# Patient Record
Sex: Female | Born: 1985 | Hispanic: No | Marital: Married | Smoking: Never smoker
Health system: Southern US, Community
[De-identification: ages and names within clinical notes are randomized; demographics above are authoritative.]

## PROBLEM LIST (undated history)

## (undated) DIAGNOSIS — Z789 Other specified health status: Secondary | ICD-10-CM

## (undated) HISTORY — PX: NO PAST SURGERIES: SHX2092

---

## 2018-01-06 ENCOUNTER — Encounter (HOSPITAL_COMMUNITY): Payer: Self-pay | Admitting: Emergency Medicine

## 2018-01-06 ENCOUNTER — Other Ambulatory Visit: Payer: Self-pay

## 2018-01-06 DIAGNOSIS — Z3A09 9 weeks gestation of pregnancy: Secondary | ICD-10-CM | POA: Diagnosis not present

## 2018-01-06 DIAGNOSIS — O219 Vomiting of pregnancy, unspecified: Secondary | ICD-10-CM | POA: Diagnosis present

## 2018-01-06 DIAGNOSIS — O9989 Other specified diseases and conditions complicating pregnancy, childbirth and the puerperium: Secondary | ICD-10-CM | POA: Insufficient documentation

## 2018-01-06 DIAGNOSIS — R102 Pelvic and perineal pain: Secondary | ICD-10-CM | POA: Insufficient documentation

## 2018-01-06 DIAGNOSIS — O210 Mild hyperemesis gravidarum: Secondary | ICD-10-CM | POA: Diagnosis not present

## 2018-01-06 NOTE — ED Triage Notes (Signed)
Patient presents to the ED with complaints of emesis and lower abdominal pain x1.5 months. [place]. Patient found out recently that she is pregnant. Patient unable to tolerate PO. No urinary symptoms, but patient states her urine is dark and shes going only 2x a day.

## 2018-01-07 ENCOUNTER — Emergency Department (HOSPITAL_COMMUNITY): Payer: Medicaid Other

## 2018-01-07 ENCOUNTER — Emergency Department (HOSPITAL_COMMUNITY)
Admission: EM | Admit: 2018-01-07 | Discharge: 2018-01-07 | Disposition: A | Discharge status: Home / Self Care | Payer: Medicaid Other | Attending: Emergency Medicine | Admitting: Emergency Medicine

## 2018-01-07 DIAGNOSIS — O210 Mild hyperemesis gravidarum: Secondary | ICD-10-CM

## 2018-01-07 LAB — COMPREHENSIVE METABOLIC PANEL
ALBUMIN: 3.7 g/dL (ref 3.5–5.0)
ALT: 29 U/L (ref 0–44)
ANION GAP: 10 (ref 5–15)
AST: 27 U/L (ref 15–41)
Alkaline Phosphatase: 93 U/L (ref 38–126)
BILIRUBIN TOTAL: 0.9 mg/dL (ref 0.3–1.2)
BUN: 8 mg/dL (ref 6–20)
CHLORIDE: 106 mmol/L (ref 98–111)
CO2: 19 mmol/L — ABNORMAL LOW (ref 22–32)
Calcium: 9.1 mg/dL (ref 8.9–10.3)
Creatinine, Ser: 0.4 mg/dL — ABNORMAL LOW (ref 0.44–1.00)
GFR calc Af Amer: >60 mL/min (ref >60)
GFR calc non Af Amer: >60 mL/min (ref >60)
GLUCOSE: 89 mg/dL (ref 70–99)
POTASSIUM: 3.5 mmol/L (ref 3.5–5.1)
Sodium: 135 mmol/L (ref 135–145)
TOTAL PROTEIN: 7.4 g/dL (ref 6.5–8.1)

## 2018-01-07 LAB — CBC WITH DIFFERENTIAL/PLATELET
Basophils Absolute: 0 10*3/uL (ref 0.0–0.1)
Basophils Relative: 0 %
EOS PCT: 1 %
Eosinophils Absolute: 0 10*3/uL (ref 0.0–0.7)
HEMATOCRIT: 36.7 % (ref 36.0–46.0)
Hemoglobin: 12.4 g/dL (ref 12.0–15.0)
Lymphocytes Relative: 45 %
Lymphs Abs: 2.3 10*3/uL (ref 0.7–4.0)
MCH: 28.2 pg (ref 26.0–34.0)
MCHC: 33.8 g/dL (ref 30.0–36.0)
MCV: 83.6 fL (ref 78.0–100.0)
MONO ABS: 0.4 10*3/uL (ref 0.1–1.0)
MONOS PCT: 7 %
Neutro Abs: 2.3 10*3/uL (ref 1.7–7.7)
Neutrophils Relative %: 47 %
PLATELETS: 217 10*3/uL (ref 150–400)
RBC: 4.39 MIL/uL (ref 3.87–5.11)
RDW: 14.2 % (ref 11.5–15.5)
WBC: 5 10*3/uL (ref 4.0–10.5)

## 2018-01-07 LAB — LIPASE, BLOOD: Lipase: 36 U/L (ref 11–51)

## 2018-01-07 LAB — HCG, QUANTITATIVE, PREGNANCY: hCG, Beta Chain, Quant, S: 147566 m[IU]/mL — ABNORMAL HIGH (ref <5)

## 2018-01-07 MED ORDER — DOXYLAMINE SUCCINATE (SLEEP) 25 MG PO TABS: 12.5000 mg | ORAL_TABLET | Freq: Two times a day (BID) | ORAL | 0 refills | Status: AC | PRN

## 2018-01-07 MED ORDER — ONDANSETRON HCL 4 MG/2ML IJ SOLN
4.0000 mg | Freq: Once | INTRAMUSCULAR | Status: AC
  Administered 2018-01-07: 4 mg via INTRAVENOUS
  Filled 2018-01-07: qty 2

## 2018-01-07 MED ORDER — ONDANSETRON 4 MG PO TBDP: 4.0000 mg | ORAL_TABLET | Freq: Three times a day (TID) | ORAL | 0 refills | Status: AC | PRN

## 2018-01-07 MED ORDER — PYRIDOXINE HCL 25 MG PO TABS: 12.5000 mg | ORAL_TABLET | Freq: Two times a day (BID) | ORAL | 0 refills | Status: AC | PRN

## 2018-01-07 MED ORDER — SODIUM CHLORIDE 0.9 % IV BOLUS
1000.0000 mL | Freq: Once | INTRAVENOUS | Status: AC
  Administered 2018-01-07: 1000 mL via INTRAVENOUS

## 2018-01-07 NOTE — ED Provider Notes (Signed)
[facility]-EMERGENCY DEPT Provider Note  CSN: [id number] Arrival date & time: 01/06/18 2119  Chief Complaint(s) Emesis  HPI Jane Flores is a 32 y.o. female G3, P2 approximately [redacted] weeks pregnant by last menstrual period (June 1) who presents to the emergency department with persistent nausea and vomiting associated to the pregnancy.  She reports that over the last 2 weeks her symptoms have worsened stating that she is not able to tolerate much p.o. intake.  She denies any recent fevers or infections.  No recent antibiotic use.  No known sick contacts.  Patient just returned from her home country of [place].  She is endorsing lower abdominal discomfort that is been as constant for the past several weeks.  No alleviating or aggravating factors.  She denies any urinary symptoms.  No vaginal bleeding or discharge.  HPI  Past Medical History History reviewed. No pertinent past medical history. There are no active problems to display for this patient.  Home Medication(s) Prior to Admission medications   Medication Sig Start Date End Date Taking? Authorizing Provider  folic acid (FOLVITE) 400 MCG tablet Take 400 mcg by mouth 2 (two) times daily.   Yes [provider]  doxylamine, Sleep, (UNISOM) 25 MG tablet Take 0.5 tablets (12.5 mg total) by mouth 2 (two) times daily as needed (nausea). 01/07/18 02/06/18  Nira Connardama, Iam Lipson Eduardo, MD  ondansetron (ZOFRAN ODT) 4 MG disintegrating tablet Take 1 tablet (4 mg total) by mouth every 8 (eight) hours as needed for up to 3 days for nausea or vomiting. 01/07/18 01/10/18  Dalinda Heidt, Amadeo GarnetPedro Eduardo, MD  pyridOXINE (VITAMIN B-6) 25 MG tablet Take 0.5 tablets (12.5 mg total) by mouth 2 (two) times daily as needed. 01/07/18 02/06/18  Nira Connardama, Benard Minturn Eduardo, MD                                                                                                                                    Past Surgical History History reviewed. No pertinent  surgical history. Family History No family history on file.  Social History Social History   Tobacco Use  . Smoking status: Never Smoker  . Smokeless tobacco: Never Used  Substance Use Topics  . Alcohol use: Never    Frequency: Never  . Drug use: Never   Allergies Patient has no known allergies.  Review of Systems Review of Systems All other systems are reviewed and are negative for acute change except as noted in the HPI  Physical Exam Vital Signs  I have reviewed the triage vital signs BP 121/85   Pulse 68   Temp 98.4 F (36.9 C) (Oral)   Resp 20   SpO2 100%   Physical Exam  Constitutional: She is oriented to person, place, and time. She appears well-developed and well-nourished. No distress.  HENT:  Head: Normocephalic and atraumatic.  Right Ear: External ear normal.  Left Ear: External ear normal.  Nose: Nose normal.  Eyes: Conjunctivae and EOM are normal. No scleral icterus.  Neck: Normal range of motion and phonation normal.  Cardiovascular: Normal rate and regular rhythm.  Pulmonary/Chest: Effort normal. No stridor. No respiratory distress.  Abdominal: She exhibits no distension. There is tenderness (discomfort) in the suprapubic area. There is no rigidity, no rebound and no guarding.  Musculoskeletal: Normal range of motion. She exhibits no edema.  Neurological: She is alert and oriented to person, place, and time.  Skin: She is not diaphoretic.  Psychiatric: She has a normal mood and affect. Her behavior is normal.  Vitals reviewed.   ED Results and Treatments Labs (all labs ordered are listed, but only abnormal results are displayed) Labs Reviewed  COMPREHENSIVE METABOLIC PANEL - Abnormal; Notable for the following components:      Result Value   CO2 19 (*)    Creatinine, Ser 0.40 (*)    All other components within normal limits  HCG, QUANTITATIVE, PREGNANCY - Abnormal; Notable for the following components:   hCG, Beta Chain, Quant, S 147,566 (*)     All other components within normal limits  CBC WITH DIFFERENTIAL/PLATELET  LIPASE, BLOOD                                                                                                                         EKG  EKG Interpretation  Date/Time:    Ventricular Rate:    PR Interval:    QRS Duration:   QT Interval:    QTC Calculation:   R Axis:     Text Interpretation:        Radiology US Ob Comp < 14 Wks  Result Date: 01/07/2018 CLINICAL DATA:  Acute onset of generalized abdominal discomfort. EXAM: OBSTETRIC <14 WK ULTRASOUND TECHNIQUE: Transabdominal ultrasound was performed for evaluation of the gestation as well as the maternal uterus and adnexal regions. COMPARISON:  None. FINDINGS: Intrauterine gestational sac: Single; visualized and normal in shape. Yolk sac:  No Embryo:  Yes Cardiac Activity: Yes Heart Rate: 126 bpm CRL: 3.6 cm   10 w 3 d                  [place] EDC: 08/02/2018 Subchorionic hemorrhage: A small amount of subchorionic hemorrhage is noted. Maternal uterus/adnexae: The uterus is otherwise unremarkable in appearance. The ovaries are within normal limits. There is no evidence of ovarian torsion. No suspicious adnexal masses are seen. No free fluid is seen within the pelvic cul-de-sac. IMPRESSION: 1. Single live intrauterine pregnancy, with a crown-rump length of 3.6 cm, corresponding to a gestational age of [redacted] weeks 3 days. This reflects an estimated date of delivery of August 02, 2018. 2. Small amount of subchorionic hemorrhage noted. Electronically Signed   By: Roanna Raider M.D.   On: 01/07/2018 04:18   Pertinent labs & imaging results that were available during my care of the patient were reviewed by me and considered in my medical decision making (see chart for details).  Medications Ordered  in ED Medications  sodium chloride 0.9 % bolus 1,000 mL (0 mLs Intravenous Stopped 01/07/18 0247)  ondansetron (ZOFRAN) injection 4 mg (4 mg Intravenous Given 01/07/18 0145)                                                                                                                                     Procedures Procedures  (including critical care time)  Medical Decision Making / ED Course I have reviewed the nursing notes for this encounter and the patient's prior records (if available in EHR or on provided paperwork).    Patient here with nausea vomiting in the setting of pregnancy.  Has pelvic discomfort.  Ultrasound revealed IUP at 10 weeks and 3 days.  Labs grossly reassuring without significant electrolyte derangements or renal insufficiency.  CBC reassuring.  Patient provided with symptomatic management and IV fluids.  She was able to tolerate oral hydration following this.  No suspicion for serious intra-abdominal inflammatory/infectious process.  No need for advanced imaging at this time.  The patient appears reasonably screened and/or stabilized for discharge and I doubt any other medical condition or other [facility] requiring further screening, evaluation, or treatment in the ED at this time prior to discharge.  The patient is safe for discharge with strict return precautions.   Final Clinical Impression(s) / ED Diagnoses Final diagnoses:  Hyperemesis gravidarum   Disposition: Discharge  Condition: Good  I have discussed the results, Dx and Tx plan with the patient who expressed understanding and agree(s) with the plan. Discharge instructions discussed at great length. The patient was given strict return precautions who verbalized understanding of the instructions. No further questions at time of discharge.    ED Discharge Orders         Ordered    ondansetron (ZOFRAN ODT) 4 MG disintegrating tablet  Every 8 hours PRN     01/07/18 0520    pyridOXINE (VITAMIN B-6) 25 MG tablet  2 times daily PRN     01/07/18 0520    doxylamine, Sleep, (UNISOM) 25 MG tablet  2 times daily PRN     01/07/18 0520           Follow Up: Obstetrician  Schedule an  appointment as soon as possible for a visit  As needed      This chart was dictated using voice recognition software.  Despite best efforts to proofread,  errors can occur which can change the documentation meaning.   Nira Conn, MD 01/07/18 [phone]

## 2018-02-21 LAB — OB RESULTS CONSOLE RUBELLA ANTIBODY, IGM: RUBELLA: IMMUNE

## 2018-02-21 LAB — OB RESULTS CONSOLE GC/CHLAMYDIA
Chlamydia: NEGATIVE
Gonorrhea: NEGATIVE

## 2018-02-21 LAB — OB RESULTS CONSOLE ANTIBODY SCREEN: Antibody Screen: NEGATIVE

## 2018-02-21 LAB — OB RESULTS CONSOLE ABO/RH: RH Type: POSITIVE

## 2018-02-21 LAB — OB RESULTS CONSOLE HIV ANTIBODY (ROUTINE TESTING): HIV: NONREACTIVE

## 2018-02-21 LAB — OB RESULTS CONSOLE HEPATITIS B SURFACE ANTIGEN: Hepatitis B Surface Ag: NEGATIVE

## 2018-02-21 LAB — OB RESULTS CONSOLE RPR: RPR: NONREACTIVE

## 2018-05-16 ENCOUNTER — Other Ambulatory Visit (HOSPITAL_COMMUNITY): Payer: Self-pay | Admitting: Obstetrics and Gynecology

## 2018-05-16 DIAGNOSIS — Z3A30 30 weeks gestation of pregnancy: Secondary | ICD-10-CM

## 2018-05-16 DIAGNOSIS — Z363 Encounter for antenatal screening for malformations: Secondary | ICD-10-CM

## 2018-05-16 DIAGNOSIS — O4403 Placenta previa specified as without hemorrhage, third trimester: Secondary | ICD-10-CM

## 2018-05-17 ENCOUNTER — Encounter (HOSPITAL_COMMUNITY): Payer: Self-pay

## 2018-05-26 ENCOUNTER — Ambulatory Visit (HOSPITAL_COMMUNITY): Payer: Medicaid Other

## 2018-06-08 ENCOUNTER — Encounter (HOSPITAL_COMMUNITY): Payer: Self-pay | Admitting: *Deleted

## 2018-06-12 ENCOUNTER — Other Ambulatory Visit (HOSPITAL_COMMUNITY): Payer: Self-pay | Admitting: Obstetrics and Gynecology

## 2018-06-12 ENCOUNTER — Other Ambulatory Visit (HOSPITAL_COMMUNITY): Payer: Self-pay | Admitting: *Deleted

## 2018-06-12 ENCOUNTER — Ambulatory Visit (HOSPITAL_COMMUNITY)
Admission: RE | Admit: 2018-06-12 | Discharge: 2018-06-12 | Disposition: A | Discharge status: Home / Self Care | Payer: Medicaid Other | Source: Ambulatory Visit | Attending: Obstetrics and Gynecology | Admitting: Obstetrics and Gynecology

## 2018-06-12 ENCOUNTER — Encounter (HOSPITAL_COMMUNITY): Payer: Self-pay

## 2018-06-12 DIAGNOSIS — O4403 Placenta previa specified as without hemorrhage, third trimester: Secondary | ICD-10-CM | POA: Insufficient documentation

## 2018-06-12 DIAGNOSIS — Z3A32 32 weeks gestation of pregnancy: Secondary | ICD-10-CM

## 2018-06-12 DIAGNOSIS — Z3A30 30 weeks gestation of pregnancy: Secondary | ICD-10-CM | POA: Diagnosis present

## 2018-06-12 DIAGNOSIS — O4400 Placenta previa specified as without hemorrhage, unspecified trimester: Secondary | ICD-10-CM

## 2018-06-12 DIAGNOSIS — Z363 Encounter for antenatal screening for malformations: Secondary | ICD-10-CM | POA: Insufficient documentation

## 2018-06-12 HISTORY — DX: Other specified health status: Z78.9

## 2018-07-06 ENCOUNTER — Encounter (HOSPITAL_COMMUNITY): Payer: Self-pay

## 2018-07-10 ENCOUNTER — Encounter (HOSPITAL_COMMUNITY): Payer: Self-pay

## 2018-07-10 ENCOUNTER — Ambulatory Visit (HOSPITAL_COMMUNITY)
Admission: RE | Admit: 2018-07-10 | Discharge: 2018-07-10 | Disposition: A | Discharge status: Home / Self Care | Payer: Medicaid Other | Source: Ambulatory Visit | Attending: Obstetrics and Gynecology | Admitting: Obstetrics and Gynecology

## 2018-07-10 DIAGNOSIS — Z362 Encounter for other antenatal screening follow-up: Secondary | ICD-10-CM | POA: Diagnosis not present

## 2018-07-10 DIAGNOSIS — Z3A36 36 weeks gestation of pregnancy: Secondary | ICD-10-CM | POA: Diagnosis not present

## 2018-07-10 DIAGNOSIS — O4400 Placenta previa specified as without hemorrhage, unspecified trimester: Secondary | ICD-10-CM

## 2018-07-11 ENCOUNTER — Encounter (HOSPITAL_COMMUNITY): Payer: Self-pay | Admitting: *Deleted

## 2018-07-11 ENCOUNTER — Inpatient Hospital Stay (HOSPITAL_COMMUNITY)
Admission: AD | Admit: 2018-07-11 | Discharge: 2018-07-11 | Disposition: A | Discharge status: Home / Self Care | Payer: Medicaid Other | Source: Ambulatory Visit | Attending: Obstetrics | Admitting: Obstetrics

## 2018-07-11 ENCOUNTER — Telehealth (HOSPITAL_COMMUNITY): Payer: Self-pay | Admitting: *Deleted

## 2018-07-11 ENCOUNTER — Other Ambulatory Visit: Payer: Self-pay | Admitting: Obstetrics and Gynecology

## 2018-07-11 ENCOUNTER — Encounter (HOSPITAL_COMMUNITY): Payer: Self-pay

## 2018-07-11 DIAGNOSIS — O4700 False labor before 37 completed weeks of gestation, unspecified trimester: Secondary | ICD-10-CM

## 2018-07-11 DIAGNOSIS — O4403 Placenta previa specified as without hemorrhage, third trimester: Secondary | ICD-10-CM | POA: Diagnosis not present

## 2018-07-11 DIAGNOSIS — Z3A36 36 weeks gestation of pregnancy: Secondary | ICD-10-CM | POA: Insufficient documentation

## 2018-07-11 DIAGNOSIS — O479 False labor, unspecified: Secondary | ICD-10-CM

## 2018-07-11 LAB — URINALYSIS, ROUTINE W REFLEX MICROSCOPIC
Bilirubin Urine: NEGATIVE
Glucose, UA: NEGATIVE mg/dL
Hgb urine dipstick: NEGATIVE
Ketones, ur: NEGATIVE mg/dL
LEUKOCYTES UA: NEGATIVE
NITRITE: NEGATIVE
PROTEIN: NEGATIVE mg/dL
Specific Gravity, Urine: <1.005 — ABNORMAL LOW (ref 1.005–1.030)
pH: 5 (ref 5.0–8.0)

## 2018-07-11 MED ORDER — DIPHENHYDRAMINE HCL 50 MG/ML IJ SOLN
25.0000 mg | Freq: Once | INTRAMUSCULAR | Status: AC
  Administered 2018-07-11: 25 mg via INTRAVENOUS
  Filled 2018-07-11: qty 1

## 2018-07-11 MED ORDER — LACTATED RINGERS IV SOLN
Freq: Once | INTRAVENOUS | Status: AC
  Administered 2018-07-11: 03:00:00 via INTRAVENOUS

## 2018-07-11 MED ORDER — METOCLOPRAMIDE HCL 5 MG/ML IJ SOLN
10.0000 mg | Freq: Once | INTRAMUSCULAR | Status: AC
  Administered 2018-07-11: 10 mg via INTRAVENOUS
  Filled 2018-07-11: qty 2

## 2018-07-11 MED ORDER — ACETAMINOPHEN 500 MG PO TABS
1000.0000 mg | ORAL_TABLET | Freq: Once | ORAL | Status: AC
  Administered 2018-07-11: 1000 mg via ORAL
  Filled 2018-07-11: qty 2

## 2018-07-11 MED ORDER — LACTATED RINGERS IV SOLN
Freq: Once | INTRAVENOUS | Status: AC
  Administered 2018-07-11: 04:00:00 via INTRAVENOUS

## 2018-07-11 NOTE — Progress Notes (Signed)
Pt called out crying and c/o of dizziness after IV benadryl.  Explained to pt benadryl may can cause dizziness and result in sleeping. Helped pt to bathroom. Pt resting in bed.

## 2018-07-11 NOTE — MAU Note (Signed)
PT SAYS  STRONG UC'S SINCE 3PM.  PNC  WITH DR Chestine SporeLARK --  U/S TODAY-  SHOWS  STILL PLACENTA PREVIA.  -  BUT NO BLEEDING

## 2018-07-11 NOTE — Progress Notes (Signed)
Discharge instructions given. Pt  v/s wnl and stable for discharge.

## 2018-07-11 NOTE — Discharge Instructions (Signed)
Placenta Previa °Placenta previa is a condition in which the placenta implants in the lower part of the uterus in pregnant women. The placenta either partially or completely covers the opening to the cervix. This is a problem because the baby must pass through the cervix during delivery. There are three types of placenta previa: °· Marginal placenta previa. The placenta reaches within an inch (2.5 cm) of the cervical opening but does not cover it. °· Partial placenta previa. The placenta covers part of the cervical opening. °· Complete placenta previa. The placenta covers the entire cervical opening. °If the previa is marginal or partial and it is diagnosed in the first half of pregnancy, the placenta may move into a normal position as the pregnancy progresses and may no longer cover the cervix. It is important to keep all prenatal visits with your health care provider so you can be more closely monitored. °What are the causes? °The cause of this condition is not known. °What increases the risk? °This condition is more likely to develop in women who: °· Are carrying more than one baby (multiples). °· Have an abnormally shaped uterus. °· Have scars on the lining of the uterus. °· Have had surgeries involving the uterus, such as a cesarean delivery. °· Have delivered a baby before. °· Have a history of placenta previa. °· Have smoked or used cocaine during pregnancy. °· Are age 35 or older during pregnancy. °What are the signs or symptoms? °The main symptom of this condition is sudden, painless vaginal bleeding during the second half of pregnancy. The amount of bleeding can be very light at first, and it usually stops on its own. Heavier bleeding episodes may also happen. Some women with placenta previa may have no bleeding at all. °How is this diagnosed? °· This condition is diagnosed: °? From an ultrasound. This test uses sound waves to find where the placenta is located before you have any bleeding  episodes. °? During a checkup after vaginal bleeding is noticed. °· If you are diagnosed with a partial or complete previa, digital exams with fingers will generally be avoided. Your health care provider will still perform a speculum exam. °· If you did not have an ultrasound during your pregnancy, placenta previa may not be diagnosed until bleeding occurs during labor. °How is this treated? °Treatment for this condition may include: °· Decreased activity. °· Bed rest at home or in the hospital. °· Pelvic rest. Nothing is placed inside the vagina during pelvic rest. This means not having sex and not using tampons or douches. °· A blood transfusion to replace blood that you have lost (maternal blood loss). °· A cesarean delivery. This may be performed if: °? The bleeding is heavy and cannot be controlled. °? The placenta completely covers the cervix. °· Medicines to stop premature labor or to help the baby's lungs to mature. This treatment may be used if you need delivery before your pregnancy is full-term. °Your treatment will be decided based on: °· How much you are bleeding, or whether the bleeding has stopped. °· How far along you are in your pregnancy. °· The condition of your baby. °· The type of placenta previa that you have. ° °Follow these instructions at home: °· Get plenty of rest and lessen activity as told by your health care provider. °· Stay on bed rest for as long as told by your health care provider. °· Do not have sex, use tampons, use a douche, or place anything inside of   plenty of rest and lessen activity as told by your health care provider.   Stay on bed rest for as long as told by your health care provider.   Do not have sex, use tampons, use a douche, or place anything inside of your vagina if your health care provider recommended pelvic rest.   Take over-the-counter and prescription medicines as told by your health care provider.   Keep all follow-up visits as told by your health care provider. This is important.  Get help right away if:   You have vaginal bleeding, even if in small amounts and even if you have no pain.   You have cramping or regular contractions.   You have pain in your abdomen or  your lower back.   You have a feeling of increased pressure in your pelvis.   You have increased watery or bloody mucus from the vagina.  This information is not intended to replace advice given to you by your health care provider. Make sure you discuss any questions you have with your health care provider.  Document Released: 05/17/2005 Document Revised: 02/04/2016 Document Reviewed: 11/29/2015  Elsevier Interactive Patient Education  2019 [employer]

## 2018-07-11 NOTE — MAU Provider Note (Addendum)
History     CSN: [id number]  Arrival date and time: 07/11/18 0126   First Provider Initiated Contact with Patient 07/11/18 0212      No chief complaint on file.  HPI Jane Flores is a 33 y.o. G3P2002 at [redacted]w[redacted]d who presents to MAU with chief complaint of abdominal contractions in the setting of placenta previa. This is a new problem onset yesterday 07/10/2018 at about 3pm.  Her contractions are located bilaterally in her lower abdomen. She rates them as 8/10. The pain does not radiate. She denies aggravating or alleviating factors. She has not taken medication or tried other treatments for this pain.  Patient denies bleeding. She also denies  leaking of fluid, decreased fetal movement, fever, falls, or recent illness.    OB History    Gravida  3   Para  2   Term  2   Preterm      AB      Living  2     SAB      TAB      Ectopic      Multiple      Live Births              Past Medical History:  Diagnosis Date  . Medical history non-contributory     Past Surgical History:  Procedure Laterality Date  . NO PAST SURGERIES      No family history on file.  Social History   Tobacco Use  . Smoking status: Never Smoker  . Smokeless tobacco: Never Used  Substance Use Topics  . Alcohol use: Never    Frequency: Never  . Drug use: Never    Allergies: No Known Allergies  Medications Prior to Admission  Medication Sig Dispense Refill Last Dose  . Ferrous Sulfate (IRON PO) Take by mouth.   07/10/2018 at Unknown time  . Prenatal Vit-Fe Fumarate-FA (PRENATAL VITAMIN PO) Take by mouth.   07/10/2018 at Unknown time  . doxylamine, Sleep, (UNISOM) 25 MG tablet Take 0.5 tablets (12.5 mg total) by mouth 2 (two) times daily as needed (nausea). 30 tablet 0   . folic acid (FOLVITE) 400 MCG tablet Take 400 mcg by mouth 2 (two) times daily.   Not Taking    Review of Systems  Constitutional: Negative for chills, fatigue and fever.  Gastrointestinal: Positive for abdominal  pain. Negative for nausea and vomiting.  Genitourinary: Negative for vaginal bleeding, vaginal discharge and vaginal pain.  Musculoskeletal: Negative for back pain.  Neurological: Negative for headaches.  All other systems reviewed and are negative.  Physical Exam   Blood pressure 126/73, pulse 81, temperature 97.9 F (36.6 C), temperature source Oral, resp. rate 20, height 5' 4.5" (1.638 m), weight 73.6 kg, last menstrual period 10/29/2017.  Physical Exam  Nursing note and vitals reviewed. Constitutional: She is oriented to person, place, and time. She appears well-developed and well-nourished.  Cardiovascular: Normal rate.  Respiratory: Effort normal.  GI: There is no abdominal tenderness.  Gravid  Neurological: She is alert and oriented to person, place, and time.  Skin: Skin is warm and dry.  Psychiatric: She has a normal mood and affect. Her behavior is normal. Judgment and thought content normal.   MAU Course/MDM  Procedures  --Dr. Chestine Spore made aware of patient's presence in MAU and plan of care at 0238 --Patient declines analgesia beyond those ordered as identified below --Reactive tracing: baseline 125, moderate variability, positive accels, no decels --Toco: irregular mild contractions resolving with IV fluids, uterine  irritability --Closed cervix confirmed with sterile speculum exam prior to discharge, s/p 2.5 hours monitoring in MAU --No bleeding noted at any time during evaluation in MAU --Patient is aware of MFM recommendation that she be scheduled for c section no later than 37 weeks. She understands that in the event of worsening acuity she would be prepped for stat cesarean section due to placenta previa  Orders Placed This Encounter  Procedures  . Urinalysis, Routine w reflex microscopic  . Insert peripheral IV  . Discontinue IV  . Discharge patient   Meds ordered this encounter  Medications  . lactated ringers infusion  . acetaminophen (TYLENOL) tablet 1,000  mg  . lactated ringers infusion  . metoCLOPramide (REGLAN) injection 10 mg  . diphenhydrAMINE (BENADRYL) injection 25 mg    Results for orders placed or performed during the hospital encounter of 07/11/18 (from the past 24 hour(s))  Urinalysis, Routine w reflex microscopic     Status: Abnormal   Collection Time: 07/11/18  2:34 AM  Result Value Ref Range   Color, Urine YELLOW YELLOW   APPearance CLEAR CLEAR   Specific Gravity, Urine <1.005 (L) 1.005 - 1.030   pH 5.0 5.0 - 8.0   Glucose, UA NEGATIVE NEGATIVE mg/dL   Hgb urine dipstick NEGATIVE NEGATIVE   Bilirubin Urine NEGATIVE NEGATIVE   Ketones, ur NEGATIVE NEGATIVE mg/dL   Protein, ur NEGATIVE NEGATIVE mg/dL   Nitrite NEGATIVE NEGATIVE   Leukocytes, UA NEGATIVE NEGATIVE     Assessment and Plan  --33 y.o. [id number] at [redacted]w[redacted]d  --Reactive tracing --Preterm contractions without cervical change or bleeding --Patient's pain 2/10 at discharge --Reviewed labor precautions in setting of previa at bedside with patient --Discharge home in stable condition  F/U: Patient has OB appt Friday 07/14/2018.   Calvert Cantor, CNM 07/11/2018, 5:00 AM

## 2018-07-14 ENCOUNTER — Encounter (HOSPITAL_COMMUNITY)
Admission: RE | Admit: 2018-07-14 | Discharge: 2018-07-14 | Disposition: A | Discharge status: Home / Self Care | Payer: Medicaid Other | Source: Ambulatory Visit | Attending: Obstetrics and Gynecology | Admitting: Obstetrics and Gynecology

## 2018-07-14 ENCOUNTER — Other Ambulatory Visit: Payer: Self-pay | Admitting: Obstetrics and Gynecology

## 2018-07-14 DIAGNOSIS — Z01812 Encounter for preprocedural laboratory examination: Secondary | ICD-10-CM | POA: Diagnosis present

## 2018-07-14 LAB — CBC
HCT: 33.7 % — ABNORMAL LOW (ref 36.0–46.0)
Hemoglobin: 10.6 g/dL — ABNORMAL LOW (ref 12.0–15.0)
MCH: 28 pg (ref 26.0–34.0)
MCHC: 31.5 g/dL (ref 30.0–36.0)
MCV: 88.9 fL (ref 80.0–100.0)
Platelets: 163 10*3/uL (ref 150–400)
RBC: 3.79 MIL/uL — ABNORMAL LOW (ref 3.87–5.11)
RDW: 17.5 % — ABNORMAL HIGH (ref 11.5–15.5)
WBC: 4 10*3/uL (ref 4.0–10.5)
nRBC: 0 % (ref 0.0–0.2)

## 2018-07-14 LAB — ABO/RH: ABO/RH(D): O POS

## 2018-07-14 MED ORDER — SODIUM CHLORIDE 0.9% IV SOLUTION: Freq: Once | INTRAVENOUS | Status: AC

## 2018-07-14 NOTE — Patient Instructions (Signed)
Jane Flores  07/14/2018   Your procedure is scheduled on:  07/17/2018  Enter through the Main Entrance of [facility] at 1000 AM.  Pick up the phone at the desk and dial [phone]  Call this number if you have problems the morning of surgery:[phone]  Remember:   Do not eat food:(After Midnight) Desps de medianoche.  Do not drink clear liquids: (After Midnight) Desps de medianoche.  Take these medicines the morning of surgery with A SIP OF WATER: none   Do not wear jewelry, make-up or nail polish.  Do not wear lotions, powders, or perfumes. Do not wear deodorant.  Do not shave 48 hours prior to surgery.  Do not bring valuables to the hospital.  [facility] is not   responsible for any belongings or valuables brought to the hospital.  Contacts, dentures or bridgework may not be worn into surgery.  Leave suitcase in the car. After surgery it may be brought to your room.  For patients admitted to the hospital, checkout time is 11:00 AM the day of              discharge.    N/A   Please read over the following fact sheets that you were given:   Surgical Site Infection Prevention

## 2018-07-15 LAB — RPR: RPR Ser Ql: NONREACTIVE

## 2018-07-17 ENCOUNTER — Inpatient Hospital Stay (HOSPITAL_COMMUNITY): Payer: Medicaid Other | Admitting: Anesthesiology

## 2018-07-17 ENCOUNTER — Encounter (HOSPITAL_COMMUNITY): Payer: Self-pay | Admitting: *Deleted

## 2018-07-17 ENCOUNTER — Encounter (HOSPITAL_COMMUNITY)
Admission: AD | Disposition: A | Discharge status: Home / Self Care | Payer: Self-pay | Source: Home / Self Care | Attending: Obstetrics and Gynecology

## 2018-07-17 ENCOUNTER — Inpatient Hospital Stay (HOSPITAL_COMMUNITY)
Admission: AD | Admit: 2018-07-17 | Discharge: 2018-07-20 | DRG: 788 | Disposition: A | Discharge status: Home / Self Care | Payer: Medicaid Other | Attending: Obstetrics and Gynecology | Admitting: Obstetrics and Gynecology

## 2018-07-17 DIAGNOSIS — Z3A37 37 weeks gestation of pregnancy: Secondary | ICD-10-CM | POA: Diagnosis not present

## 2018-07-17 DIAGNOSIS — O9902 Anemia complicating childbirth: Secondary | ICD-10-CM | POA: Diagnosis present

## 2018-07-17 DIAGNOSIS — O4403 Placenta previa specified as without hemorrhage, third trimester: Secondary | ICD-10-CM | POA: Diagnosis present

## 2018-07-17 DIAGNOSIS — D509 Iron deficiency anemia, unspecified: Secondary | ICD-10-CM | POA: Diagnosis present

## 2018-07-17 LAB — PREPARE RBC (CROSSMATCH)

## 2018-07-17 SURGERY — Surgical Case
Anesthesia: Spinal

## 2018-07-17 MED ORDER — FENTANYL CITRATE (PF) 100 MCG/2ML IJ SOLN: 25.0000 ug | INTRAMUSCULAR | Status: DC | PRN

## 2018-07-17 MED ORDER — KETOROLAC TROMETHAMINE 30 MG/ML IJ SOLN
30.0000 mg | Freq: Four times a day (QID) | INTRAMUSCULAR | Status: DC | PRN
  Administered 2018-07-17 – 2018-07-18 (×2): 30 mg via INTRAVENOUS
  Filled 2018-07-17 (×2): qty 1

## 2018-07-17 MED ORDER — SIMETHICONE 80 MG PO CHEW
80.0000 mg | CHEWABLE_TABLET | ORAL | Status: DC | PRN
  Administered 2018-07-19 (×2): 80 mg via ORAL

## 2018-07-17 MED ORDER — FENTANYL CITRATE (PF) 100 MCG/2ML IJ SOLN
INTRAMUSCULAR | Status: DC | PRN
  Administered 2018-07-17: 15 ug via INTRATHECAL

## 2018-07-17 MED ORDER — ACETAMINOPHEN 500 MG PO TABS
1000.0000 mg | ORAL_TABLET | Freq: Four times a day (QID) | ORAL | Status: DC
  Administered 2018-07-17 – 2018-07-18 (×3): 1000 mg via ORAL
  Filled 2018-07-17 (×3): qty 2

## 2018-07-17 MED ORDER — DIBUCAINE 1 % RE OINT: 1.0000 "application " | TOPICAL_OINTMENT | RECTAL | Status: DC | PRN

## 2018-07-17 MED ORDER — METHYLERGONOVINE MALEATE 0.2 MG/ML IJ SOLN: 0.2000 mg | INTRAMUSCULAR | Status: DC | PRN

## 2018-07-17 MED ORDER — SODIUM CHLORIDE 0.9 % IV SOLN
INTRAVENOUS | Status: DC | PRN
  Administered 2018-07-17: 13:00:00 via INTRAVENOUS

## 2018-07-17 MED ORDER — NALBUPHINE HCL 10 MG/ML IJ SOLN: 5.0000 mg | Freq: Once | INTRAMUSCULAR | Status: DC | PRN

## 2018-07-17 MED ORDER — MORPHINE SULFATE (PF) 0.5 MG/ML IJ SOLN
INTRAMUSCULAR | Status: AC
  Filled 2018-07-17: qty 10

## 2018-07-17 MED ORDER — CEFAZOLIN SODIUM-DEXTROSE 2-3 GM-%(50ML) IV SOLR: INTRAVENOUS | Status: DC | PRN

## 2018-07-17 MED ORDER — DIPHENHYDRAMINE HCL 25 MG PO CAPS
25.0000 mg | ORAL_CAPSULE | ORAL | Status: DC | PRN
  Administered 2018-07-17: 25 mg via ORAL
  Filled 2018-07-17 (×2): qty 1

## 2018-07-17 MED ORDER — MEPERIDINE HCL 25 MG/ML IJ SOLN: 6.2500 mg | INTRAMUSCULAR | Status: DC | PRN

## 2018-07-17 MED ORDER — ZOLPIDEM TARTRATE 5 MG PO TABS: 5.0000 mg | ORAL_TABLET | Freq: Every evening | ORAL | Status: DC | PRN

## 2018-07-17 MED ORDER — NALOXONE HCL 0.4 MG/ML IJ SOLN
0.4000 mg | INTRAMUSCULAR | Status: DC | PRN
  Filled 2018-07-17: qty 1

## 2018-07-17 MED ORDER — CEFAZOLIN SODIUM-DEXTROSE 2-4 GM/100ML-% IV SOLN
2.0000 g | INTRAVENOUS | Status: AC
  Administered 2018-07-17: 2 g via INTRAVENOUS
  Filled 2018-07-17: qty 100

## 2018-07-17 MED ORDER — LACTATED RINGERS IV SOLN
INTRAVENOUS | Status: DC | PRN
  Administered 2018-07-17 (×3): via INTRAVENOUS

## 2018-07-17 MED ORDER — DEXAMETHASONE SODIUM PHOSPHATE 10 MG/ML IJ SOLN
INTRAMUSCULAR | Status: AC
  Filled 2018-07-17: qty 1

## 2018-07-17 MED ORDER — NALBUPHINE HCL 10 MG/ML IJ SOLN: 5.0000 mg | INTRAMUSCULAR | Status: DC | PRN

## 2018-07-17 MED ORDER — FENTANYL CITRATE (PF) 100 MCG/2ML IJ SOLN
INTRAMUSCULAR | Status: AC
  Filled 2018-07-17: qty 2

## 2018-07-17 MED ORDER — GLYCOPYRROLATE 0.2 MG/ML IJ SOLN
INTRAMUSCULAR | Status: AC
  Filled 2018-07-17: qty 1

## 2018-07-17 MED ORDER — METHYLERGONOVINE MALEATE 0.2 MG PO TABS: 0.2000 mg | ORAL_TABLET | ORAL | Status: DC | PRN

## 2018-07-17 MED ORDER — LACTATED RINGERS IV SOLN
INTRAVENOUS | Status: DC
  Administered 2018-07-17 – 2018-07-18 (×2): via INTRAVENOUS

## 2018-07-17 MED ORDER — DIPHENHYDRAMINE HCL 50 MG/ML IJ SOLN: 12.5000 mg | INTRAMUSCULAR | Status: DC | PRN

## 2018-07-17 MED ORDER — ONDANSETRON HCL 4 MG/2ML IJ SOLN
INTRAMUSCULAR | Status: AC
  Filled 2018-07-17: qty 2

## 2018-07-17 MED ORDER — SIMETHICONE 80 MG PO CHEW
80.0000 mg | CHEWABLE_TABLET | ORAL | Status: DC
  Administered 2018-07-17 – 2018-07-19 (×3): 80 mg via ORAL
  Filled 2018-07-17 (×3): qty 1

## 2018-07-17 MED ORDER — SCOPOLAMINE 1 MG/3DAYS TD PT72
MEDICATED_PATCH | TRANSDERMAL | Status: AC
  Filled 2018-07-17: qty 1

## 2018-07-17 MED ORDER — PHENYLEPHRINE 8 MG IN D5W 100 ML (0.08MG/ML) PREMIX OPTIME
INJECTION | INTRAVENOUS | Status: DC | PRN
  Administered 2018-07-17: 60 ug/min via INTRAVENOUS

## 2018-07-17 MED ORDER — KETOROLAC TROMETHAMINE 30 MG/ML IJ SOLN
INTRAMUSCULAR | Status: AC
  Administered 2018-07-17: 30 mg via INTRAMUSCULAR
  Filled 2018-07-17: qty 1

## 2018-07-17 MED ORDER — SODIUM CHLORIDE 0.9 % IV SOLN: INTRAVENOUS | Status: DC | PRN

## 2018-07-17 MED ORDER — HYDROCODONE-ACETAMINOPHEN 5-325 MG PO TABS
1.0000 | ORAL_TABLET | ORAL | Status: DC | PRN
  Administered 2018-07-18 – 2018-07-20 (×4): 1 via ORAL
  Filled 2018-07-17 (×4): qty 1

## 2018-07-17 MED ORDER — OXYTOCIN 40 UNITS IN NORMAL SALINE INFUSION - SIMPLE MED: 2.5000 [IU]/h | INTRAVENOUS | Status: DC

## 2018-07-17 MED ORDER — SODIUM CHLORIDE 0.9 % IR SOLN
Status: DC | PRN
  Administered 2018-07-17: 1

## 2018-07-17 MED ORDER — KETOROLAC TROMETHAMINE 30 MG/ML IJ SOLN
30.0000 mg | Freq: Four times a day (QID) | INTRAMUSCULAR | Status: DC | PRN
  Administered 2018-07-17: 30 mg via INTRAMUSCULAR

## 2018-07-17 MED ORDER — PRENATAL MULTIVITAMIN CH
1.0000 | ORAL_TABLET | Freq: Every day | ORAL | Status: DC
  Administered 2018-07-18 – 2018-07-20 (×3): 1 via ORAL
  Filled 2018-07-17 (×2): qty 1

## 2018-07-17 MED ORDER — NALOXONE HCL 4 MG/10ML IJ SOLN
1.0000 ug/kg/h | INTRAVENOUS | Status: DC | PRN
  Filled 2018-07-17: qty 5

## 2018-07-17 MED ORDER — SCOPOLAMINE 1 MG/3DAYS TD PT72
1.0000 | MEDICATED_PATCH | Freq: Once | TRANSDERMAL | Status: AC
  Administered 2018-07-17: 1.5 mg via TRANSDERMAL

## 2018-07-17 MED ORDER — WITCH HAZEL-GLYCERIN EX PADS: 1.0000 "application " | MEDICATED_PAD | CUTANEOUS | Status: DC | PRN

## 2018-07-17 MED ORDER — TETANUS-DIPHTH-ACELL PERTUSSIS 5-2.5-18.5 LF-MCG/0.5 IM SUSP: 0.5000 mL | Freq: Once | INTRAMUSCULAR | Status: AC

## 2018-07-17 MED ORDER — OXYTOCIN 10 UNIT/ML IJ SOLN
INTRAMUSCULAR | Status: AC
  Filled 2018-07-17: qty 4

## 2018-07-17 MED ORDER — MENTHOL 3 MG MT LOZG: 1.0000 | LOZENGE | OROMUCOSAL | Status: DC | PRN

## 2018-07-17 MED ORDER — OXYTOCIN 10 UNIT/ML IJ SOLN
INTRAVENOUS | Status: DC | PRN
  Administered 2018-07-17: 40 [IU] via INTRAVENOUS

## 2018-07-17 MED ORDER — ACETAMINOPHEN 325 MG PO TABS
650.0000 mg | ORAL_TABLET | ORAL | Status: DC | PRN
  Administered 2018-07-18 – 2018-07-19 (×3): 650 mg via ORAL
  Filled 2018-07-17 (×3): qty 2

## 2018-07-17 MED ORDER — SIMETHICONE 80 MG PO CHEW
80.0000 mg | CHEWABLE_TABLET | Freq: Three times a day (TID) | ORAL | Status: DC
  Administered 2018-07-17 – 2018-07-20 (×4): 80 mg via ORAL
  Filled 2018-07-17 (×6): qty 1

## 2018-07-17 MED ORDER — MORPHINE SULFATE (PF) 0.5 MG/ML IJ SOLN
INTRAMUSCULAR | Status: DC | PRN
  Administered 2018-07-17: .15 mg via INTRATHECAL

## 2018-07-17 MED ORDER — ONDANSETRON HCL 4 MG/2ML IJ SOLN
INTRAMUSCULAR | Status: DC | PRN
  Administered 2018-07-17: 4 mg via INTRAVENOUS

## 2018-07-17 MED ORDER — DIPHENHYDRAMINE HCL 25 MG PO CAPS: 25.0000 mg | ORAL_CAPSULE | Freq: Four times a day (QID) | ORAL | Status: DC | PRN

## 2018-07-17 MED ORDER — COCONUT OIL OIL: 1.0000 "application " | TOPICAL_OIL | Status: DC | PRN

## 2018-07-17 MED ORDER — SODIUM CHLORIDE 0.9% FLUSH: 3.0000 mL | INTRAVENOUS | Status: DC | PRN

## 2018-07-17 MED ORDER — BUPIVACAINE IN DEXTROSE 0.75-8.25 % IT SOLN
INTRATHECAL | Status: DC | PRN
  Administered 2018-07-17: 1.6 mL via INTRATHECAL

## 2018-07-17 MED ORDER — SENNOSIDES-DOCUSATE SODIUM 8.6-50 MG PO TABS
2.0000 | ORAL_TABLET | ORAL | Status: DC
  Administered 2018-07-17 – 2018-07-18 (×2): 2 via ORAL
  Filled 2018-07-17 (×3): qty 2

## 2018-07-17 MED ORDER — ONDANSETRON HCL 4 MG/2ML IJ SOLN: 4.0000 mg | Freq: Three times a day (TID) | INTRAMUSCULAR | Status: DC | PRN

## 2018-07-17 SURGICAL SUPPLY — 32 items
CHLORAPREP W/TINT 26ML (MISCELLANEOUS) ×3 IMPLANT
CLAMP CORD UMBIL (MISCELLANEOUS) IMPLANT
CLOTH BEACON ORANGE TIMEOUT ST (SAFETY) ×3 IMPLANT
DERMABOND ADVANCED (GAUZE/BANDAGES/DRESSINGS) ×2
DERMABOND ADVANCED .7 DNX12 (GAUZE/BANDAGES/DRESSINGS) ×1 IMPLANT
DRSG OPSITE POSTOP 4X10 (GAUZE/BANDAGES/DRESSINGS) ×3 IMPLANT
ELECT REM PT RETURN 9FT ADLT (ELECTROSURGICAL) ×3
ELECTRODE REM PT RTRN 9FT ADLT (ELECTROSURGICAL) ×1 IMPLANT
EXTRACTOR VACUUM M CUP 4 TUBE (SUCTIONS) IMPLANT
EXTRACTOR VACUUM M CUP 4' TUBE (SUCTIONS)
GLOVE BIO SURGEON STRL SZ7 (GLOVE) ×3 IMPLANT
GLOVE BIOGEL PI IND STRL 7.0 (GLOVE) ×1 IMPLANT
GLOVE BIOGEL PI INDICATOR 7.0 (GLOVE) ×2
GOWN STRL REUS W/TWL LRG LVL3 (GOWN DISPOSABLE) ×6 IMPLANT
KIT ABG SYR 3ML LUER SLIP (SYRINGE) IMPLANT
NEEDLE HYPO 22GX1.5 SAFETY (NEEDLE) IMPLANT
NEEDLE HYPO 25X5/8 SAFETYGLIDE (NEEDLE) IMPLANT
NS IRRIG 1000ML POUR BTL (IV SOLUTION) ×3 IMPLANT
PACK C SECTION WH (CUSTOM PROCEDURE TRAY) ×3 IMPLANT
PAD OB MATERNITY 4.3X12.25 (PERSONAL CARE ITEMS) ×3 IMPLANT
PENCIL SMOKE EVAC W/HOLSTER (ELECTROSURGICAL) ×3 IMPLANT
RTRCTR C-SECT PINK 25CM LRG (MISCELLANEOUS) ×3 IMPLANT
SUT CHROMIC 1 CTX 36 (SUTURE) ×12 IMPLANT
SUT CHROMIC 2 0 CT 1 (SUTURE) ×3 IMPLANT
SUT PDS AB 0 CTX 60 (SUTURE) ×3 IMPLANT
SUT VIC AB 2-0 CT1 27 (SUTURE) ×2
SUT VIC AB 2-0 CT1 TAPERPNT 27 (SUTURE) ×1 IMPLANT
SUT VIC AB 4-0 KS 27 (SUTURE) IMPLANT
SYR 30ML LL (SYRINGE) IMPLANT
TOWEL OR 17X24 6PK STRL BLUE (TOWEL DISPOSABLE) ×3 IMPLANT
TRAY FOLEY W/BAG SLVR 14FR LF (SET/KITS/TRAYS/PACK) ×3 IMPLANT
WATER STERILE IRR 1000ML POUR (IV SOLUTION) ×3 IMPLANT

## 2018-07-17 NOTE — Anesthesia Preprocedure Evaluation (Signed)
Anesthesia Evaluation  Patient identified by MRN, date of birth, ID band Patient awake    Reviewed: Allergy & Precautions, NPO status , Patient's Chart, lab work & pertinent test results  Airway Mallampati: II  TM Distance: >3 FB Neck ROM: Full    Dental   Pulmonary neg pulmonary ROS,    breath sounds clear to auscultation       Cardiovascular negative cardio ROS   Rhythm:Regular Rate:Normal     Neuro/Psych negative neurological ROS     GI/Hepatic negative GI ROS, Neg liver ROS,   Endo/Other  negative endocrine ROS  Renal/GU negative Renal ROS     Musculoskeletal   Abdominal   Peds  Hematology  (+) anemia ,   Anesthesia Other Findings   Reproductive/Obstetrics (+) Pregnancy (placenta previa for c/s)                             Lab Results  Component Value Date   WBC 4.0 07/14/2018   HGB 10.6 (L) 07/14/2018   HCT 33.7 (L) 07/14/2018   MCV 88.9 07/14/2018   PLT 163 07/14/2018    Anesthesia Physical Anesthesia Plan  ASA: II  Anesthesia Plan: Spinal   Post-op Pain Management:    Induction:   PONV Risk Score and Plan: 2 and Dexamethasone and Ondansetron  Airway Management Planned: Simple Face Mask  Additional Equipment:   Intra-op Plan:   Post-operative Plan:   Informed Consent: I have reviewed the patients History and Physical, chart, labs and discussed the procedure including the risks, benefits and alternatives for the proposed anesthesia with the patient or authorized representative who has indicated his/her understanding and acceptance.       Plan Discussed with: CRNA  Anesthesia Plan Comments:         Anesthesia Quick Evaluation

## 2018-07-17 NOTE — Transfer of Care (Signed)
Immediate Anesthesia Transfer of Care Note  Patient: Jane Flores  Procedure(s) Performed: CESAREAN SECTION (N/A )  Patient Location: PACU  Anesthesia Type:Spinal  Level of Consciousness: awake, alert  and oriented  Airway & Oxygen Therapy: Patient Spontanous Breathing  Post-op Assessment: Report given to RN and Post -op Vital signs reviewed and stable  Post vital signs: Reviewed and stable  Last Vitals:  Vitals Value Taken Time  BP 112/68 07/17/2018  1:28 PM  Temp    Pulse 78 07/17/2018  1:30 PM  Resp 19 07/17/2018  1:30 PM  SpO2 98 % 07/17/2018  1:30 PM  Vitals shown include unvalidated device data.  Last Pain:  Vitals:   07/17/18 1050  TempSrc:   PainSc: 0-No pain         Complications: No apparent anesthesia complications

## 2018-07-17 NOTE — Anesthesia Procedure Notes (Signed)
Spinal  Patient location during procedure: OR Start time: 07/17/2018 12:12 PM End time: 07/17/2018 12:16 PM Staffing Anesthesiologist: Marcene Duos, MD Performed: anesthesiologist  Preanesthetic Checklist Completed: patient identified, site marked, surgical consent, pre-op evaluation, timeout performed, IV checked, risks and benefits discussed and monitors and equipment checked Spinal Block Patient position: sitting Prep: DuraPrep Patient monitoring: heart rate, cardiac monitor, continuous pulse ox and blood pressure Approach: midline Location: L3-4 Injection technique: single-shot Needle Needle type: Pencan  Needle gauge: 24 G Needle length: 9 cm Assessment Sensory level: T4

## 2018-07-17 NOTE — Anesthesia Postprocedure Evaluation (Signed)
Anesthesia Post Note  Patient: Jane Flores  Procedure(s) Performed: CESAREAN SECTION (N/A )     Patient location during evaluation: PACU Anesthesia Type: Spinal Level of consciousness: awake and alert Pain management: pain level controlled Vital Signs Assessment: post-procedure vital signs reviewed and stable Respiratory status: spontaneous breathing and respiratory function stable Cardiovascular status: blood pressure returned to baseline and stable Postop Assessment: spinal receding Anesthetic complications: no    Last Vitals:  Vitals:   07/17/18 1430 07/17/18 1446  BP: 113/77 116/76  Pulse: 85 80  Resp: 13 18  Temp: 36.4 C 37.6 C  SpO2: 100% 100%    Last Pain:  Vitals:   07/17/18 1500  TempSrc:   PainSc: 0-No pain   Pain Goal:                Epidural/Spinal Function Cutaneous sensation: Tingles (07/17/18 1500), Patient able to flex knees: Yes (07/17/18 1500), Patient able to lift hips off bed: No (07/17/18 1500), Back pain beyond tenderness at insertion site: No (07/17/18 1500), Progressively worsening motor and/or sensory loss: No (07/17/18 1500), Bowel and/or bladder incontinence post epidural: No (07/17/18 1500)  Kennieth Rad

## 2018-07-17 NOTE — Op Note (Signed)
Pre-Operative Diagnosis: 1) 37+2-week intrauterine pregnancy 2) posterior placenta previa Postoperative Diagnosis: 1) 37+2-week intrauterine pregnancy 2) posterior placenta previa Procedure: Primary low transverse cesarean section Surgeon: Dr. Waynard Reeds Assistant: Dr. Malva Limes Operative Findings: Vigorous female infant in the vertex compound presentation with Apgar scores of 8 at 1 minute and 9 at 5 minutes.  The fetal head delivered with a hand and foot.  Normal-appearing ovaries, tubes, uterus.  There was an extension of the left hand aspect of the uterine incision to towards the vagina that required 2-3 figure of 8 sutures to achieve complete hemostasis.  Specimen: Placenta for disposal EBL: Total I/O In: 3500 [I.V.:3500] Out: 567 [Urine:300; Blood:267]   Procedure:Ms. Pallanes is an 33 year old gravida 3 para 2002 at 73 weeks and 2 days estimated gestational age who presents for cesarean section. Following the appropriate informed consent the patient was brought to the operating room where spinal anesthesia was administered and found to be adequate. She was placed in the dorsal supine position with a leftward tilt. She was prepped and draped in the normal sterile fashion.  The patient was appropriately identified during a preoperative timeout procedure. The scalpel was then used to make a Pfannenstiel skin incision which was carried down to the underlying layers of soft tissue to the fascia. The fascia was incised in the midline and the fascial incision was extended laterally with Mayo scissors. The superior aspect of the fascial incision was grasped with Coker clamps x2, tented up and the rectus muscles dissected off sharply with the electrocautery unit area and the same procedure was repeated on the inferior aspect of the fascial incision. The rectus muscles were separated in the midline. The abdominal peritoneum was identified, tented up, entered sharply, and the incision was extended  superiorly and inferiorly with good visualization of the bladder. The Alexis retractor was then deployed. The vesicouterine peritoneum was identified, tented up, entered sharply, and the bladder flap was created digitally. Scalpel was then used to make a low transverse incision on the uterus which was extended laterally with blunt dissection. The fetal vertex was identified, delivered easily through the uterine incision followed by the body. The infant was bulb suctioned on the operative field cried vigorously, cord was clamped and cut and the infant was passed to the waiting neonatology team. Placenta was then delivered spontaneously, the uterus was cleared of all clot and debris. The uterine incision was repaired with #1 chromic in running locked fashion followed by a second imbricating layer.  2-3 additional figure-of-eight sutures were required to achieve hemostasis on the extension of the left uterine incision. The ovaries and tubes were inspected and normal. The Alexis retractor was removed. The uterus was returned to the abdominal cavity the abdominal cavity was cleared of all clot and debris. The abdominal peritoneum was reapproximated with 2-0 Vicryl in a running fashion, the rectus muscles was reapproximated with 2-0 chromic in a running fashion. The fascia was closed with a looped PDS in a running fashion. The skin was closed with 4-0 vicryl in a subcuticular fashion and Dermabond. All sponge lap and needle counts were correct. Patient tolerated the procedure well and recovered in stable condition following the procedure.

## 2018-07-17 NOTE — Lactation Note (Signed)
This note was copied from a baby's chart. Lactation Consultation Note  Patient Name: Jane Flores [id number] Date: 07/17/2018 Reason for consult: Initial assessment;Early term 37-38.6wks;Infant < 6lbs  6 hours old early term infant < 6 lbs who is being partially BF and formula fed by her mother, that was her feeding choice upon admission. Mom is a P3 and experienced BF. She was able to BF her first baby for 18 months and her second one for 5-6 months, mom voiced she didn't experienced any BF difficulties and that her last baby just "weaned on his own".  Mom has already started double pumping but already feeling discouraged because "nothing came out". Explained to mom that the purpose of pumping early on is mainly for breast stimulation and not to get volume, encouragement given. Mom doesn't have a pump at home, but she plans to get one on her own, she didn't participate in the [facility] program, LC recommended a couple of brands. Baby is being supplemented with Similac 22 calorie formula due to birth weight.  Baby wasn't in the room when [facility] came to visit mom, she was taking to the nursery for observation due to a choking episode. Asked mom to call for assistance when needed once baby is back in the room. LC reviewed hand expression with mom and she was able to get a small drop of colostrum out of her left breast. Reviewed LPI guidelines and supplementation amounts given and increasing every 24 hours. LC also set up again her DEBP and reviewed the instructions, mom had taken it apart. Mom was also given an update about her baby at the nursery, she kept asking about baby's status.  Feeding plan  1. Encouraged mom to feed baby STS every 3 hours or sooner if feeding cues are present 2. Mom will continue supplementing baby with Similac 22 calorie formula according to formula supplementation guidelines 3. She'll pump every 3 hours after feedings and at least once at night, and will feed baby any amount of EBM  she may get  BF brochure, BF resources and feeding diary were reviewed. Mom reported all questions and concerns were answered, she's aware of LC services and will call PRN.  Maternal Data Formula Feeding for Exclusion: Yes Reason for exclusion: Mother's choice to formula and breast feed on admission Has patient been taught Hand Expression?: Yes Does the patient have breastfeeding experience prior to this delivery?: Yes  Feeding Feeding Type: Bottle Fed - Formula  LATCH Score Latch: Repeated attempts needed to sustain latch, nipple held in mouth throughout feeding, stimulation needed to elicit sucking reflex.  Audible Swallowing: None  Type of Nipple: Everted at rest and after stimulation  Comfort (Breast/Nipple): Filling, red/small blisters or bruises, mild/mod discomfort  Hold (Positioning): Assistance needed to correctly position infant at breast and maintain latch.  LATCH Score: 5  Interventions Interventions: Breast feeding basics reviewed;DEBP;Breast compression;Hand express;Breast massage  Lactation Tools Discussed/Used Tools: Pump Breast pump type: Double-Electric Breast Pump WIC Program: No Pump Review: Setup, frequency, and cleaning Initiated by:: RN Date initiated:: 07/17/18   Consult Status Consult Status: Follow-up Date: 07/18/18 Follow-up type: In-patient    Jane Flores 07/17/2018, 7:14 PM

## 2018-07-17 NOTE — H&P (Signed)
Jane Flores is a 33 y.o. female presenting for primary cesarean section  33 yo G3P2002 @ 37+2 presents for primary cesarean section for persistant placenta previa. Otherwise her pregnancy has been uncomplicated except for iron deficiency anemia OB History    Gravida  3   Para  2   Term  2   Preterm      AB      Living  2     SAB      TAB      Ectopic      Multiple      Live Births             Past Medical History:  Diagnosis Date  . Medical history non-contributory    Past Surgical History:  Procedure Laterality Date  . NO PAST SURGERIES     Family History: family history is not on file. Social History:  reports that she has never smoked. She has never used smokeless tobacco. She reports that she does not drink alcohol or use drugs.     Maternal Diabetes: No Genetic Screening: Declined Maternal Ultrasounds/Referrals: Normal except previa Fetal Ultrasounds or other Referrals:  Referred to Materal Fetal Medicine  Maternal Substance Abuse:  No Significant Maternal Medications:  None Significant Maternal Lab Results:  None Other Comments:  None  ROS History   Blood pressure 122/81, pulse 78, temperature 98 F (36.7 C), resp. rate 20, height 5\' 8"  (1.727 m), weight 72.2 kg, last menstrual period 10/29/2017. Exam Physical Exam  Prenatal labs: ABO, Rh: --/--/O POS, O POS Performed at [facility], [address], [place], [place] [address]  (02/14 1000) Antibody: NEG (02/14 1000) Rubella: Immune (09/24 0000) RPR: Non Reactive (02/14 1000)  HBsAg: Negative (09/24 0000)  HIV: Non-reactive (09/24 0000)  GBS:     Assessment/Plan: 1) Admit 2) Ancef OCTOR 3) T&C x 2 units 4) Proceed with primary cesarean section 5) SCDs   Waynard Reeds 07/17/2018, 11:19 AM

## 2018-07-18 LAB — BPAM RBC
Blood Product Expiration Date: 202003102359
Blood Product Expiration Date: 202003102359
Unit Type and Rh: 5100
Unit Type and Rh: 5100

## 2018-07-18 LAB — TYPE AND SCREEN
ABO/RH(D): O POS
Antibody Screen: NEGATIVE
Unit division: 0
Unit division: 0

## 2018-07-18 LAB — CBC
HCT: 26.3 % — ABNORMAL LOW (ref 36.0–46.0)
Hemoglobin: 8.1 g/dL — ABNORMAL LOW (ref 12.0–15.0)
MCH: 27.5 pg (ref 26.0–34.0)
MCHC: 30.8 g/dL (ref 30.0–36.0)
MCV: 89.2 fL (ref 80.0–100.0)
Platelets: 143 10*3/uL — ABNORMAL LOW (ref 150–400)
RBC: 2.95 MIL/uL — ABNORMAL LOW (ref 3.87–5.11)
RDW: 17.8 % — ABNORMAL HIGH (ref 11.5–15.5)
WBC: 8 10*3/uL (ref 4.0–10.5)

## 2018-07-18 LAB — BIRTH TISSUE RECOVERY COLLECTION (PLACENTA DONATION)

## 2018-07-18 MED ORDER — LACTATED RINGERS IV BOLUS
500.0000 mL | Freq: Once | INTRAVENOUS | Status: AC
  Administered 2018-07-18: 500 mL via INTRAVENOUS

## 2018-07-18 MED ORDER — IBUPROFEN 800 MG PO TABS
800.0000 mg | ORAL_TABLET | Freq: Three times a day (TID) | ORAL | Status: DC
  Administered 2018-07-18 – 2018-07-20 (×6): 800 mg via ORAL
  Filled 2018-07-18 (×6): qty 1

## 2018-07-18 MED ORDER — FERROUS SULFATE 325 (65 FE) MG PO TABS
325.0000 mg | ORAL_TABLET | Freq: Three times a day (TID) | ORAL | Status: DC
  Administered 2018-07-18 – 2018-07-20 (×7): 325 mg via ORAL
  Filled 2018-07-18 (×7): qty 1

## 2018-07-18 NOTE — Anesthesia Postprocedure Evaluation (Signed)
Anesthesia Post Note  Patient: Jane Flores  Procedure(s) Performed: CESAREAN SECTION (N/A )     Patient location during evaluation: Mother Baby Anesthesia Type: Spinal Level of consciousness: awake, awake and alert and oriented Pain management: pain level controlled Vital Signs Assessment: post-procedure vital signs reviewed and stable Respiratory status: spontaneous breathing Cardiovascular status: blood pressure returned to baseline Postop Assessment: no headache, no backache, spinal receding, patient able to bend at knees, no apparent nausea or vomiting, adequate PO intake and able to ambulate Anesthetic complications: no    Last Vitals:  Vitals:   07/18/18 0221 07/18/18 0546  BP: (!) 91/59 94/69  Pulse: (!) 53 (!) 48  Resp: 16 18  Temp: 37.2 C 36.5 C  SpO2:      Last Pain:  Vitals:   07/18/18 0546  TempSrc: Oral  PainSc: 0-No pain   Pain Goal:                   Cleda Clarks

## 2018-07-18 NOTE — Progress Notes (Signed)
UOP and vitals reported to Dr. Henderson Cloud. Provider ordered 500 bolus then resume 125/hr LR. If UOP does not increase to 60 cc per hour by 1440 place foley and report to MD.

## 2018-07-18 NOTE — Addendum Note (Signed)
Addendum  created 07/18/18 0818 by Cleda Clarks, CRNA   Clinical Note Signed

## 2018-07-18 NOTE — Progress Notes (Signed)
  Patient is eating, ambulating, voiding.  Pain control is good.  Vitals:   07/17/18 1700 07/17/18 2130 07/18/18 0221 07/18/18 0546  BP: 105/74 (!) 96/55 (!) 91/59 94/69  Pulse: 68 (!) 57 (!) 53 (!) 48  Resp: 17 16 16 18   Temp: 98.5 F (36.9 C) 97.7 F (36.5 C) 98.9 F (37.2 C) 97.7 F (36.5 C)  TempSrc:  Oral Oral Oral  SpO2:      Weight:      Height:        lungs:   clear to auscultation cor:    RRR Abdomen:  soft, appropriate tenderness, incisions intact and without erythema or exudate ex:    no cords   Lab Results  Component Value Date   WBC 8.0 07/18/2018   HGB 8.1 (L) 07/18/2018   HCT 26.3 (L) 07/18/2018   MCV 89.2 07/18/2018   PLT 143 (L) 07/18/2018    --/--/O POS, O POS Performed at [facility], [address] [place], [place] [address]  (02/14 1000)/RI  A/P    Post operative day 1.  Routine post op and postpartum care.  Expect d/c tomorrow.  Percocet for pain control. Iron for anemia.

## 2018-07-18 NOTE — Lactation Note (Signed)
This note was copied from a baby's chart. Lactation Consultation Note  Patient Name: Girl Siham Stoner [id number] Date: 07/18/2018 Reason for consult: Follow-up assessment;Early term 69-38.6wks  P3 mother whose infant is now 69 hours old.  Mother breast fed her first child for 18 months and her second child for 5-6 months.  This is an ETI at 37+2 weeks and weighing < 6 lbs.  Baby was sleeping in bassinet when I arrived and mother was using the DEBP. Her feeding choice on admission was breast/bottle and she has been supplementing with Similac22..  Mother stated that baby is feeding well and she has no questions/concerns at this time.  She denies pain with latching.  Mother has been pumping after every feeding and giving back any EBM she obtains with pumping.  Encouraged mother to call when baby is breast feeding again so RN/LC may assess latch.  Encouraged to continue feeding with cues and to include hand expression before/after feedings and pumping.  Colostrum container provided for any EBM she obtains.  Milk storage times reviewed.  Father present.   Maternal Data Formula Feeding for Exclusion: Yes Reason for exclusion: Mother's choice to formula and breast feed on admission Has patient been taught Hand Expression?: Yes Does the patient have breastfeeding experience prior to this delivery?: Yes  Feeding Feeding Type: Bottle Fed - Formula Nipple Type: Slow - flow  LATCH Score                   Interventions    Lactation Tools Discussed/Used WIC Program: No Initiated by:: Already initiated   Consult Status Consult Status: Follow-up Date: 07/19/18 Follow-up type: In-patient    Brynnlie Unterreiner R Nobuo Nunziata 07/18/2018, 4:09 PM

## 2018-07-19 NOTE — Progress Notes (Signed)
Patient understands good english. She knows to call out for xtra pain meds. NT told to order her some breakfast. Patient denies headaches. States she is urinating good and has not had any blood clots. Plan of care told.

## 2018-07-19 NOTE — Progress Notes (Signed)
Patient is eating, ambulating, voiding.  Pain control is good.  Appropriate lochia, no complaints.  Vitals:   07/18/18 1116 07/18/18 1402 07/18/18 2202 07/19/18 0535  BP:  106/67 107/69 110/71  Pulse:  64 70 71  Resp:  15 18 16   Temp: 98.3 F (36.8 C) 98 F (36.7 C) 98.6 F (37 C) 98.5 F (36.9 C)  TempSrc: Oral Oral Oral Oral  SpO2:   100% 100%  Weight:      Height:        Fundus firm Inc: c/d/i Ext: no calf tenderness  Lab Results  Component Value Date   WBC 8.0 07/18/2018   HGB 8.1 (L) 07/18/2018   HCT 26.3 (L) 07/18/2018   MCV 89.2 07/18/2018   PLT 143 (L) 07/18/2018    --/--/O POS, O POS Performed at [facility], [address] [place], [place] [address]  (02/14 1000)  A/P Post op day #1 s/p primary c/s for placenta previa Encouraged ambulation Anemia- FeSO4 bid Pt prefers to stay additional night  Routine care.  Expect d/c 2/20.    Jane Flores

## 2018-07-19 NOTE — Lactation Note (Signed)
This note was copied from a baby's chart. Lactation Consultation Note  Patient Name: Girl Pressley Valenzano [id number] Date: 07/19/2018 Reason for consult: Follow-up assessment;Early term 36-38.6wks  P3 mother whose infant is now 71 hours old.  Mother breast fed her first child for 18 months and her second child for 5-6 months.  This is an ETI at 37+2 weeiks weighing < 6 lbs.  Baby was sleeping in bassinet when I arrived and mother was resting.  She had no questions/concerns related to breast feeding and stated that baby is feeding well.  She denies pain with latching and does not desire any assistance with latching.  Her feeding choice is breast/bottle and she has been supplementing with Similac 22.  Mother is using the DEBP for help increase milk supply for supplementation and knows to feed back any EBM she obtains to baby.  She is familiar with feeding cues and hand expression.  Encouraged her to call for assistance as needed.  Mother verbalized understanding.   Maternal Data Formula Feeding for Exclusion: Yes Reason for exclusion: Mother's choice to formula and breast feed on admission Has patient been taught Hand Expression?: Yes Does the patient have breastfeeding experience prior to this delivery?: Yes  Feeding Feeding Type: Breast Fed  LATCH Score                   Interventions    Lactation Tools Discussed/Used WIC Program: No   Consult Status Consult Status: Follow-up Date: 07/20/18 Follow-up type: In-patient    Taysha Majewski R Aldan Camey 07/19/2018, 3:03 PM

## 2018-07-20 MED ORDER — HYDROCODONE-ACETAMINOPHEN 5-325 MG PO TABS: 1.0000 | ORAL_TABLET | ORAL | 0 refills | Status: AC | PRN

## 2018-07-20 NOTE — Lactation Note (Signed)
This note was copied from a baby's chart. Lactation Consultation Note  Patient Name: Jane Flores [id number] Date: 07/20/2018 Reason for consult: Follow-up assessment;Early term 37-38.6wks;Infant < 6lbs Baby is 70 hours old/4% weight loss.  Mom feeling breast fullness this morning.  Breasts filling but soft.  Mom states baby is latching with ease but she chooses to give baby a small amount of formula if she still acts hungry after a feeding.  Mom does not have WIC but would like to be certified.  No questions or concerns.  Lactation outpatient services and support information reviewed and encouraged prn.  Maternal Data    Feeding Feeding Type: Formula Nipple Type: Slow - flow  LATCH Score                   Interventions    Lactation Tools Discussed/Used     Consult Status Consult Status: Complete Follow-up type: Call as needed    Huston Foley 07/20/2018, 10:58 AM

## 2018-07-20 NOTE — Discharge Summary (Signed)
Obstetric Discharge Summary Reason for Admission: cesarean section Prenatal Procedures: NST Intrapartum Procedures: cesarean: low cervical, transverse Postpartum Procedures: none Complications-Operative and Postpartum: none   Discharge Diagnoses: Term Pregnancy-delivered  Discharge Information: Date: 07/20/2018 Activity: pelvic rest Diet: routine Medications: Iron and Vicodin Condition: stable Instructions: refer to practice specific booklet Discharge to: home Follow-up Information    Waynard Reeds, MD Follow up in 4 week(s).   Specialty:  Obstetrics and Gynecology Contact information: [address] [place] [address] [phone]           Newborn Data: Live born female  Birth Weight: 5 lb 8.7 oz (2515 g) APGAR: 8, 9  Newborn Delivery   Birth date/time:  07/17/2018 12:39:00 Delivery type:  C-Section, Low Transverse Trial of labor:  No C-section categorization:  Primary     Home with mother.  Loney Laurence 07/20/2018, 8:41 AM

## 2018-07-20 NOTE — Progress Notes (Addendum)
  Patient is eating, ambulating, voiding.  Pain control is good.  Pt has bad headache this am but still wants to go home.  BPs have all been normal.  Vitals:   07/19/18 0535 07/19/18 1438 07/19/18 2211 07/20/18 0507  BP: 110/71 94/62 (!) 104/57 99/64  Pulse: 71 64 68 64  Resp: 16 16  14   Temp: 98.5 F (36.9 C) 98 F (36.7 C) 98.3 F (36.8 C) 98.3 F (36.8 C)  TempSrc: Oral Oral Oral Oral  SpO2: 100% 98%  100%  Weight:      Height:        lungs:   clear to auscultation cor:    RRR Abdomen:  soft, appropriate tenderness, incisions intact and without erythema or exudate ex:    no cords   Lab Results  Component Value Date   WBC 8.0 07/18/2018   HGB 8.1 (L) 07/18/2018   HCT 26.3 (L) 07/18/2018   MCV 89.2 07/18/2018   PLT 143 (L) 07/18/2018    --/--/O POS, O POS Performed at [facility], [address], [place], [place] [address]  (02/14 1000)/[place]  A/P    Post operative day 2.  Routine post op and postpartum care.  Expect d/c routine.  Percocet for pain control. Iron for anemia.

## 2019-11-12 IMAGING — US US OB COMP LESS 14 WK
1 series · 14 of 28 positions shown · non-contrast
Comparison: None.

CLINICAL DATA: Acute onset of generalized abdominal discomfort.

EXAM:
OBSTETRIC <14 WK ULTRASOUND
TECHNIQUE: Transabdominal ultrasound was performed for evaluation of the
gestation as well as the maternal uterus and adnexal regions.

[Series 1: us ob comp less 14 wk · 14 of 113 slices shown]
[im 5/113]
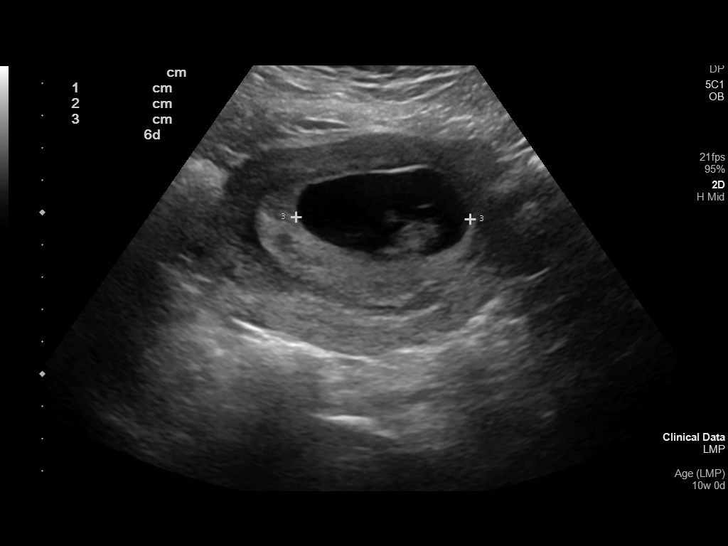
[im 13/113]
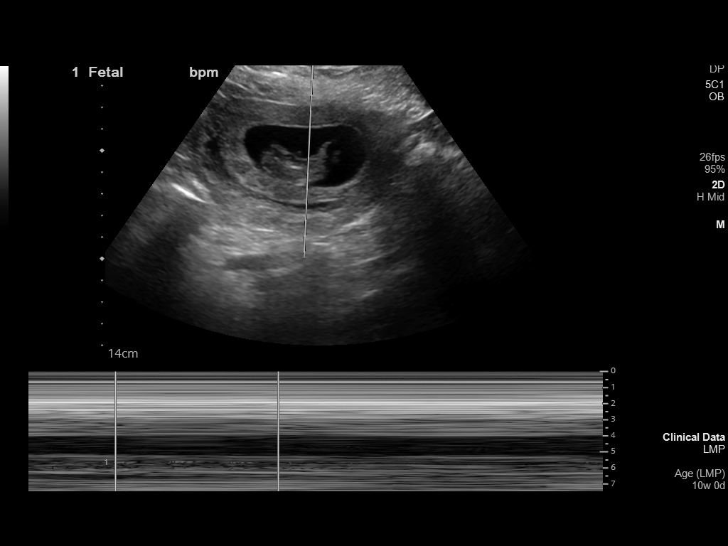
[im 21/113]
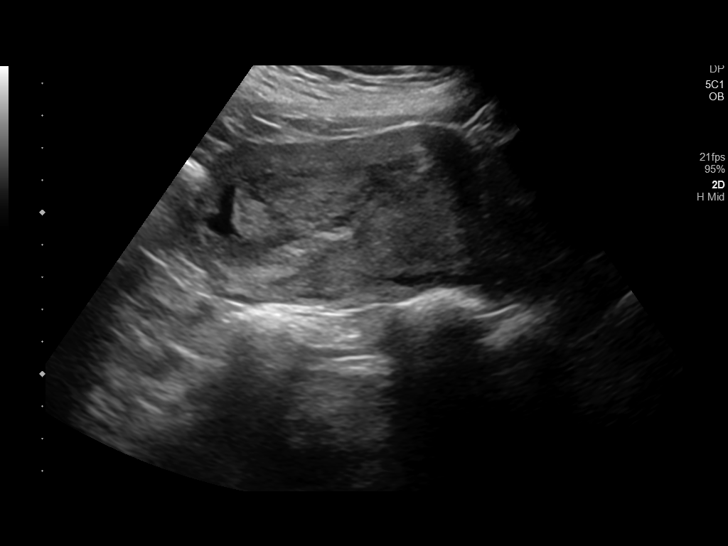
[im 30/113]
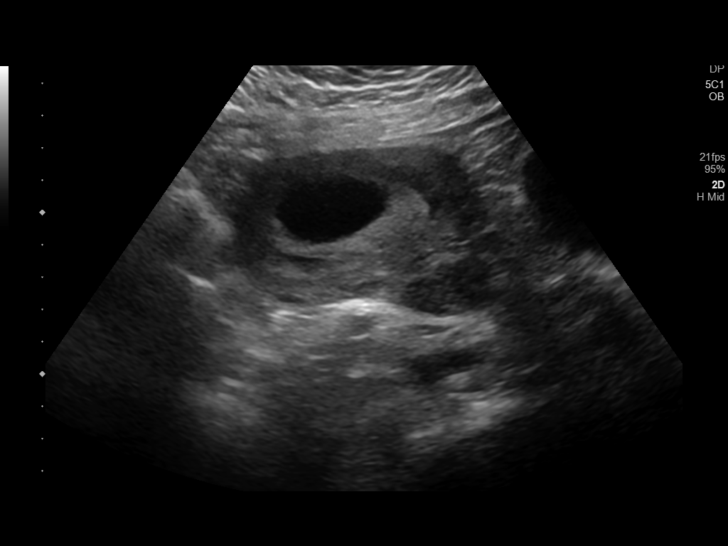
[im 38/113]
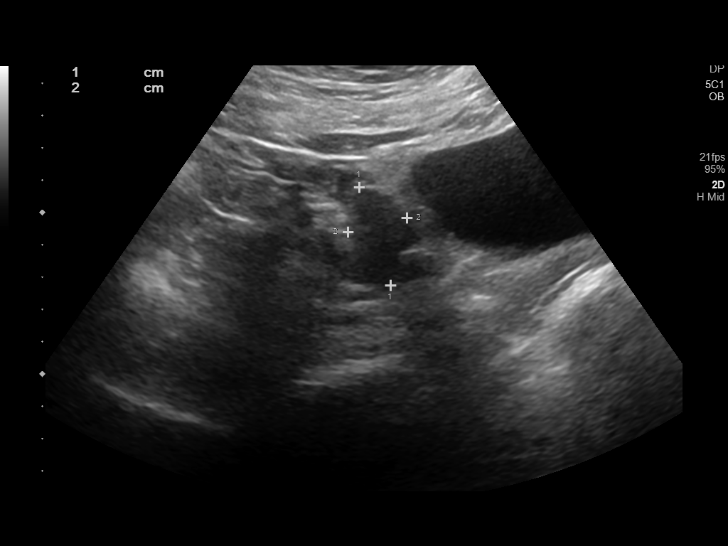
[im 46/113]
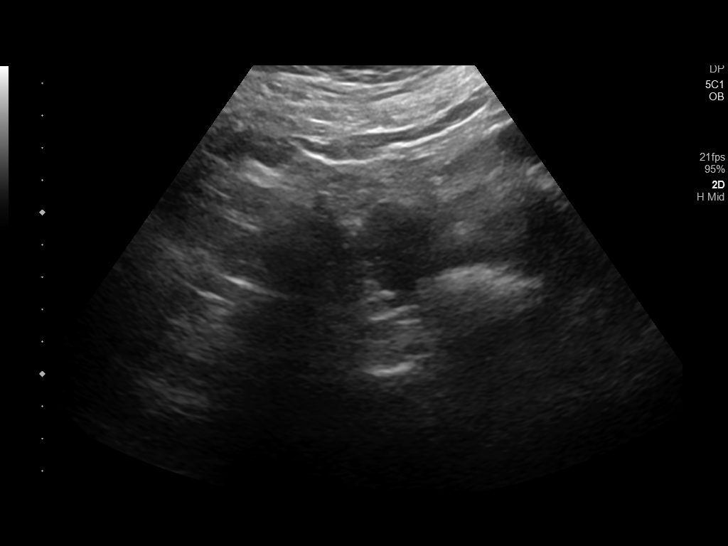
[im 54/113]
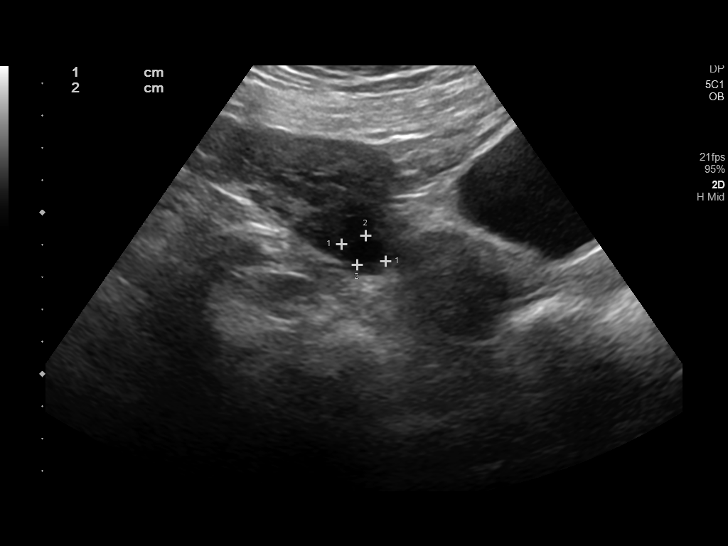
[im 63/113]
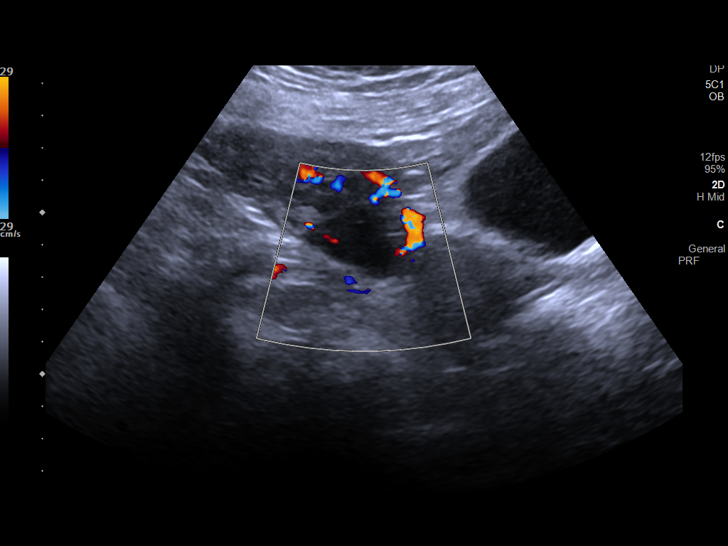
[im 71/113]
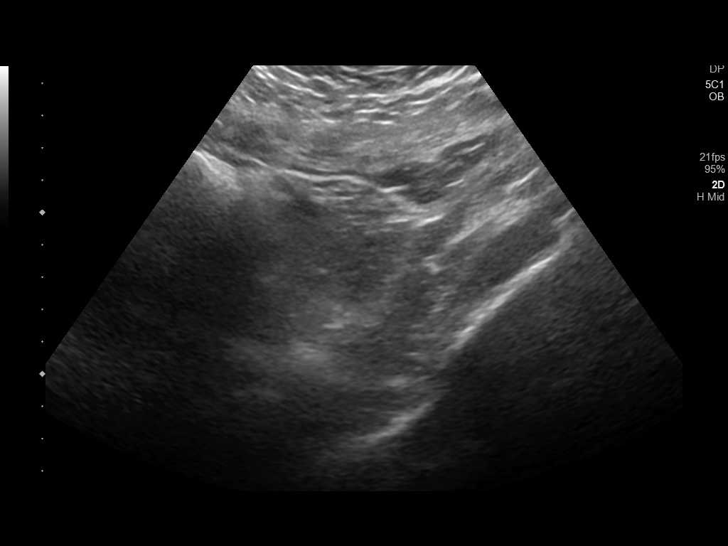
[im 79/113]
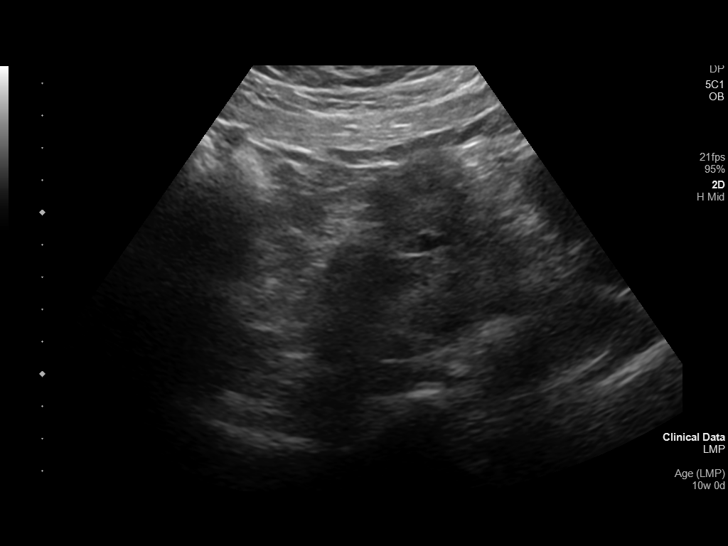
[im 88/113]
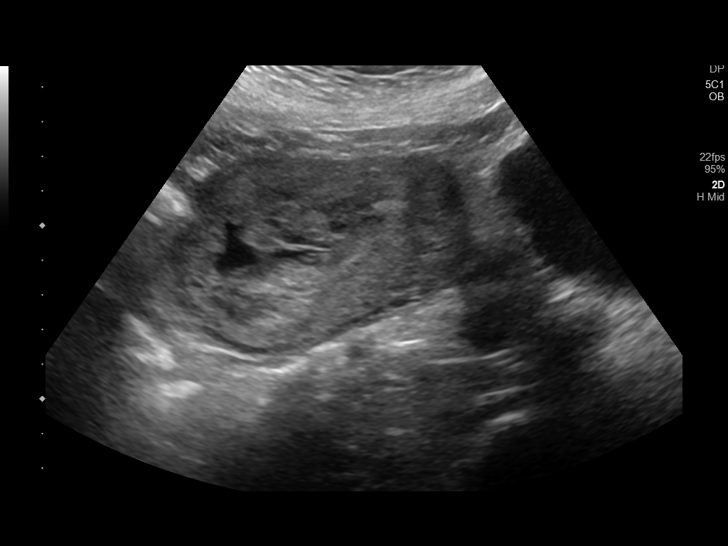
[im 96/113]
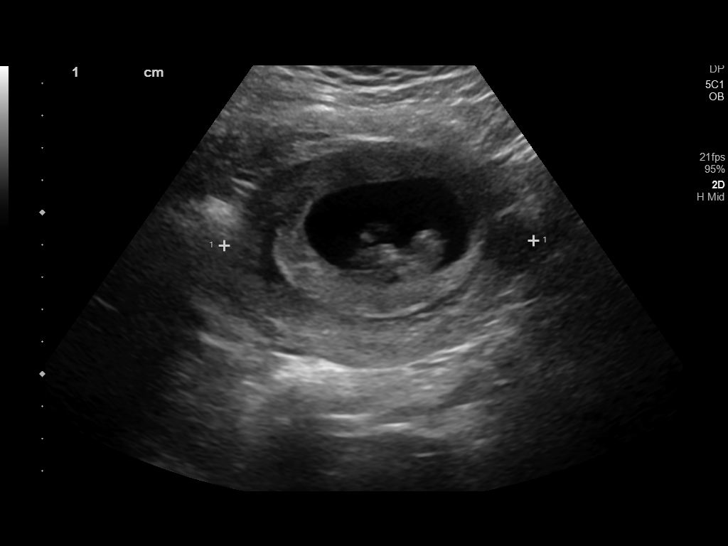
[im 104/113]
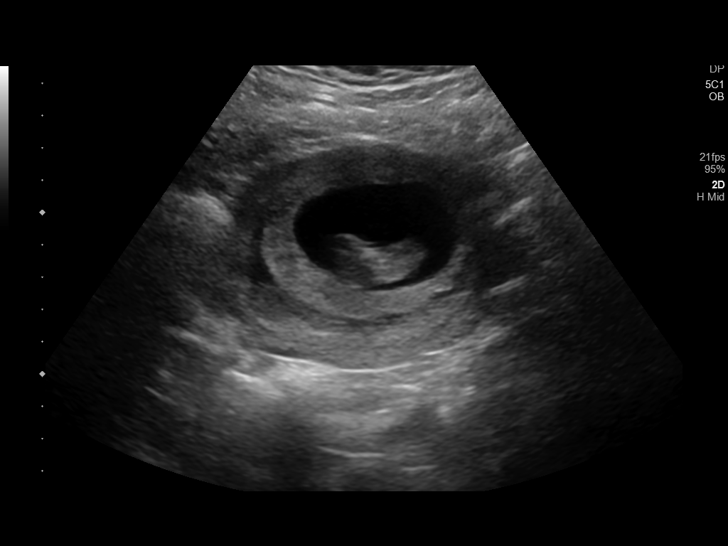
[im 113/113]
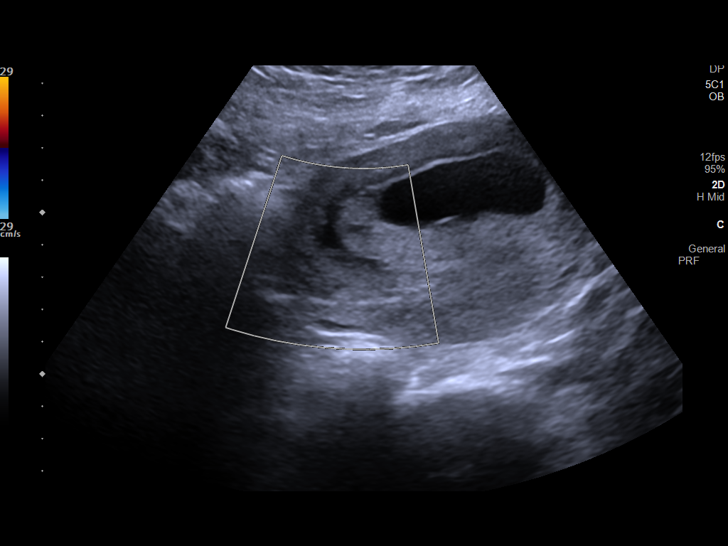

[14 of 28 positions shown; findings below may reference images not displayed]

FINDINGS: Intrauterine gestational sac: Single; visualized and normal in
shape.

Yolk sac:  No

Embryo:  Yes

Cardiac Activity: Yes

Heart Rate: 126 bpm

CRL: 3.6 cm   10 w 3 d                  US EDC: 08/02/2018

Subchorionic hemorrhage: A small amount of subchorionic hemorrhage
is noted.

Maternal uterus/adnexae: The uterus is otherwise unremarkable in
appearance. The ovaries are within normal limits. There is no
evidence of ovarian torsion. No suspicious adnexal masses are seen.

No free fluid is seen within the pelvic cul-de-sac.
IMPRESSION: 1. Single live intrauterine pregnancy, with a crown-rump length of
3.6 cm, corresponding to a gestational age of 10 weeks 3 days. This
reflects an estimated date of delivery August 02, 2018.
2. Small amount of subchorionic hemorrhage noted.

## 2020-05-14 IMAGING — US US MFM OB TRANSVAGINAL
1 series · 14 of 28 positions shown · non-contrast
Comparison: none

[Series 1: us mfm ob transvaginal · 37 acquisitions, 14 frames shown]
[im 2/37]
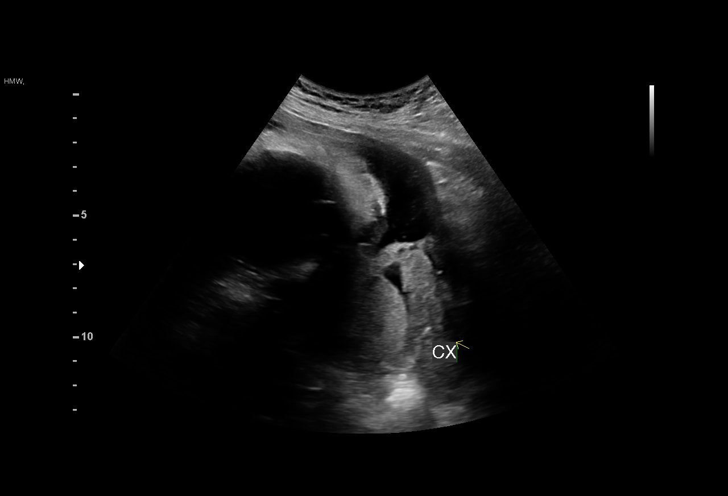
[im 5/37]
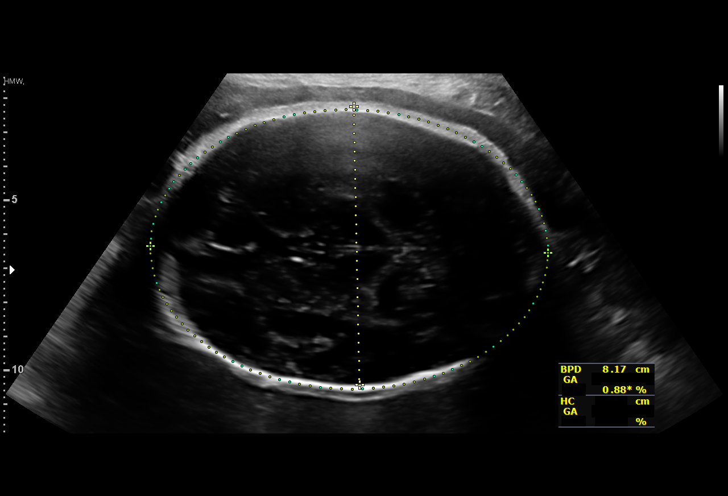
[im 7/37]
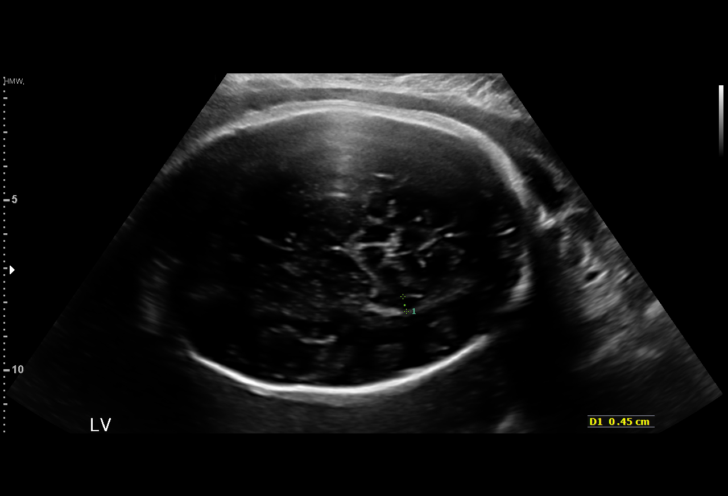
[im 10/37]
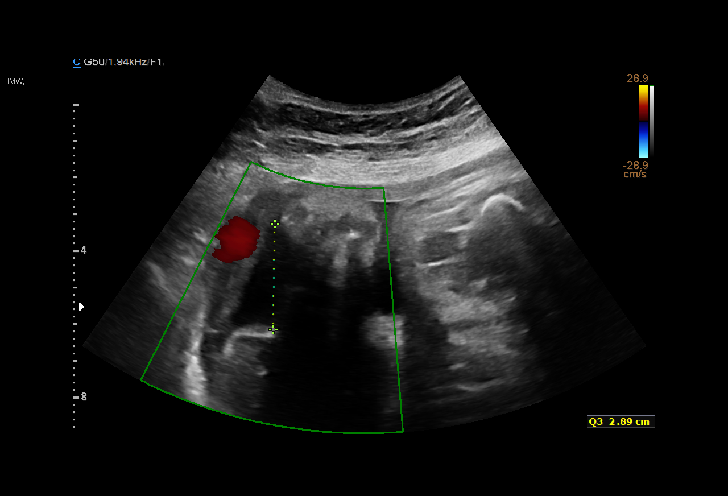
[im 13/37]
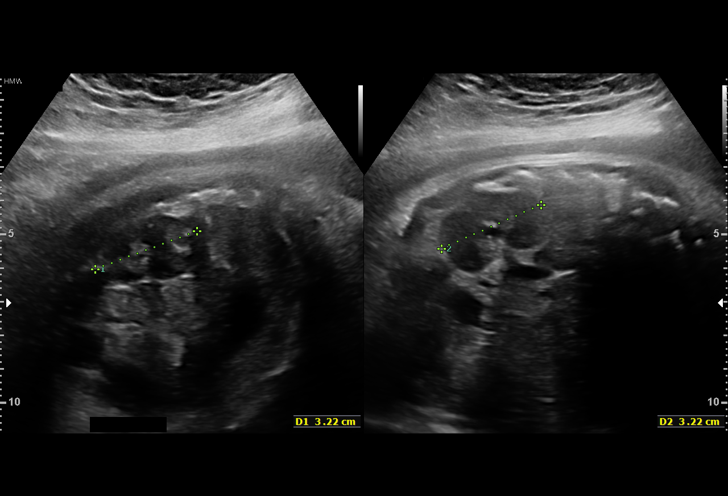
[im 15/37]
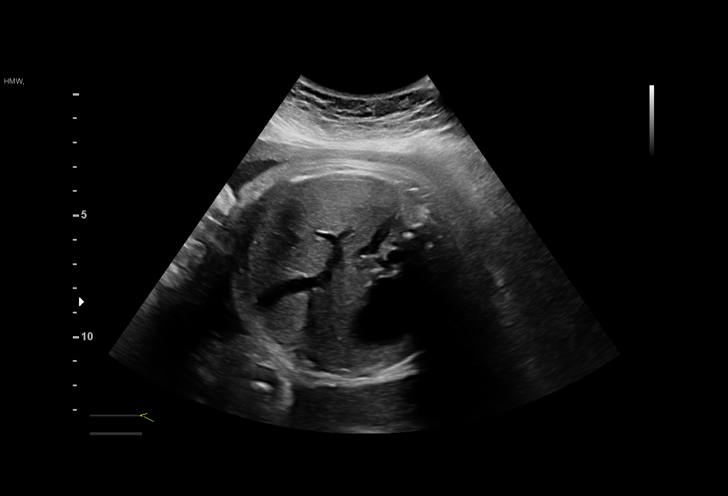
[im 18/37]
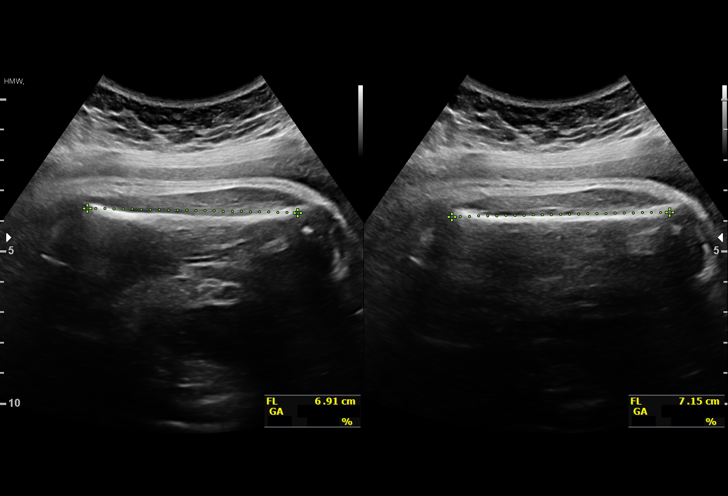
[im 21/37]
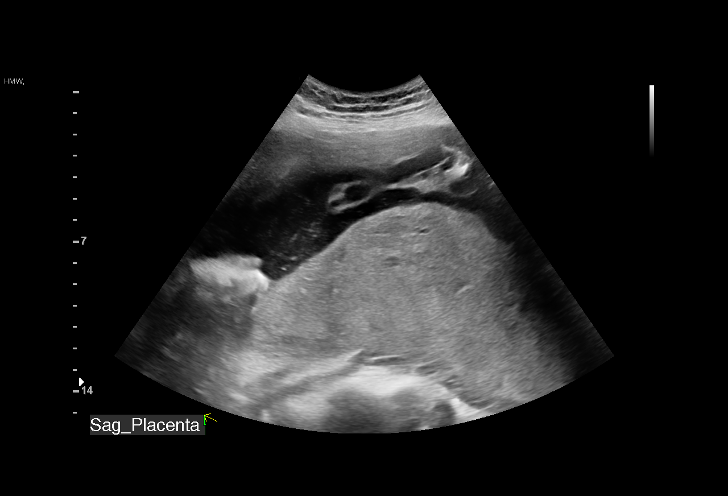
[im 23/37]
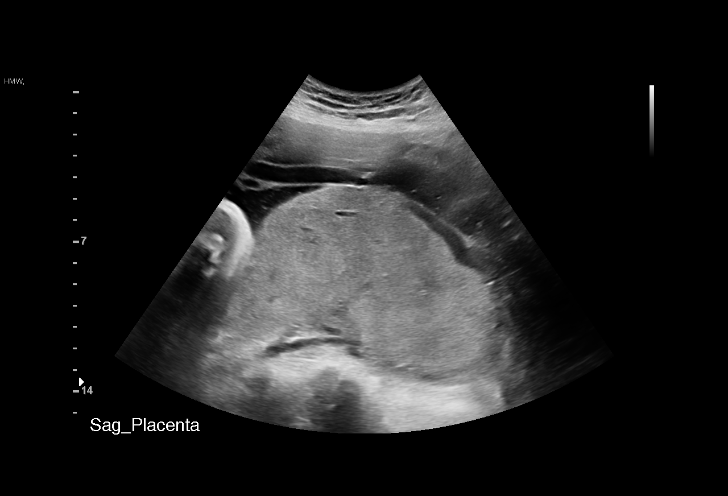
[im 26/37]
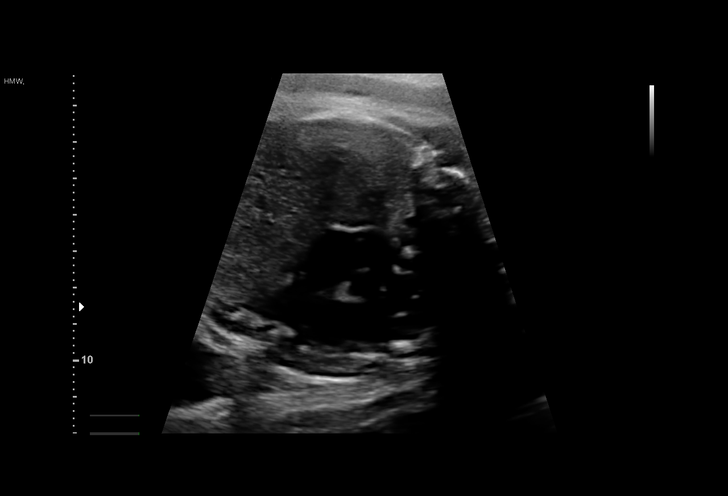
[im 29/37]
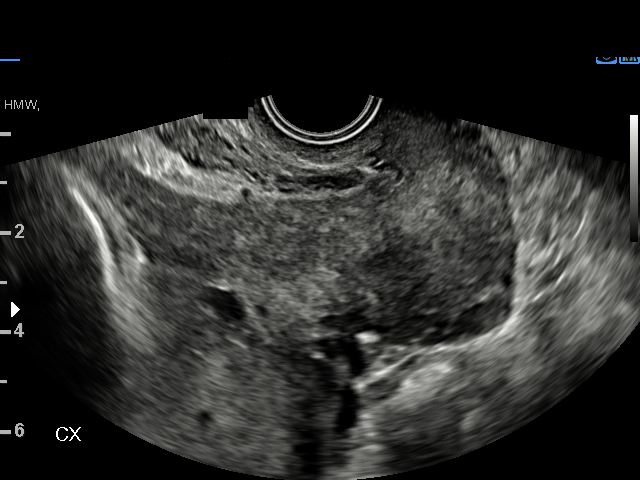
[im 31/37]
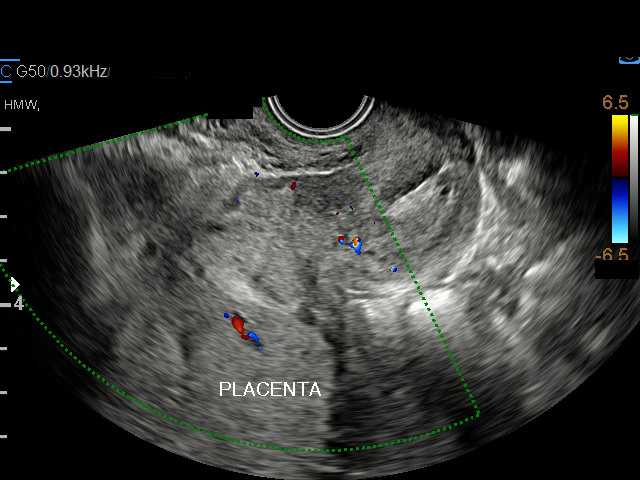
[im 34/37]
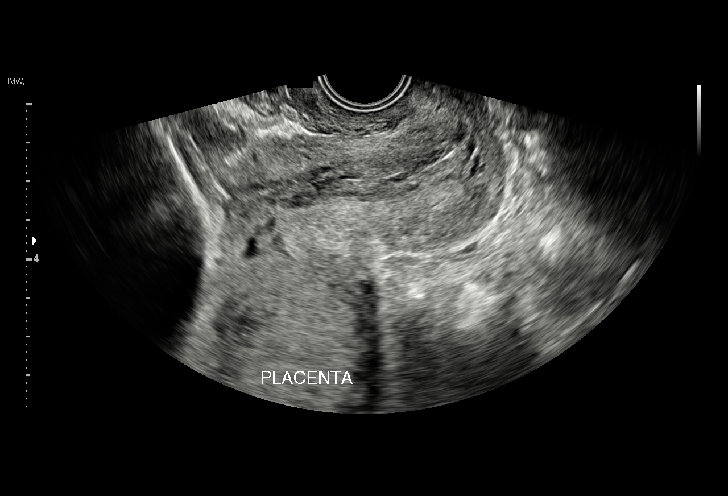
[im 37/37]
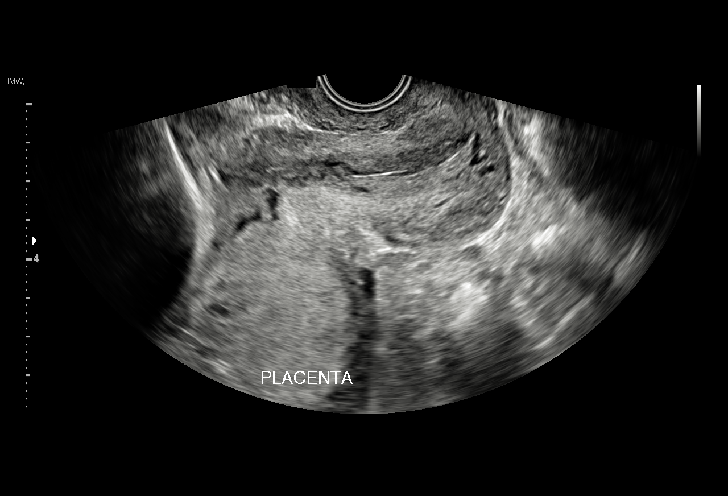

[14 of 28 positions shown; findings below may reference images not displayed]

Road; [HOSPITAL]

 ----------------------------------------------------------------------

 ----------------------------------------------------------------------
Indications

  36 weeks gestation of pregnancy
  Placenta previa specified as without
  hemorrhage, third trimester
  Encounter for other antenatal screening
  follow-up
 ----------------------------------------------------------------------
Fetal Evaluation

 Num Of Fetuses:         1
 Fetal Heart Rate(bpm):  155
 Cardiac Activity:       Observed
 Presentation:           Cephalic
 Placenta:               Posterior Previa
 P. Cord Insertion:      Previously Visualized

 Amniotic Fluid
 AFI FV:      Within normal limits

 AFI Sum(cm)     %Tile       Largest Pocket(cm)
 10.92           29

 RUQ(cm)       RLQ(cm)       LUQ(cm)        LLQ(cm)
 3.44          0
Biometry

 BPD:      81.4  mm     G. Age:  32w 5d        < 1  %    CI:        66.28   %    70 - 86
                                                         FL/HC:      21.9   %    20.1 -
 HC:      320.6  mm     G. Age:  36w 1d         20  %    HC/AC:      1.07        0.93 -
 AC:      299.7  mm     G. Age:  34w 0d          7  %    FL/BPD:     86.4   %    71 - 87
 FL:       70.3  mm     G. Age:  36w 0d         41  %    FL/AC:      23.5   %    20 - 24

 Est. FW:    6967  gm      5 lb 7 oz     24  %
OB History

 Gravidity:    3         Term:   2
 Living:       2
Gestational Age

 LMP:           36w 2d        Date:  10/29/17                 EDD:   08/05/18
 U/S Today:     34w 5d                                        EDD:   08/16/18
 Best:          36w 2d     Det. By:  LMP  (10/29/17)          EDD:   08/05/18
Anatomy

 Cranium:               Appears normal         Aortic Arch:            Previously seen
 Cavum:                 Previously seen        Ductal Arch:            Not well visualized
 Ventricles:            Appears normal         Diaphragm:              Not well visualized
 Choroid Plexus:        Not well visualized    Stomach:                Appears normal, left
                                                                       sided
 Cerebellum:            Previously seen        Abdomen:                Previously seen
 Posterior Fossa:       Previously seen        Abdominal Wall:         Not well visualized
 Nuchal Fold:           Not applicable (>20    Cord Vessels:           Previously seen
                        wks GA)
 Face:                  Orbits previously      Kidneys:                Appear normal
                        seen
 Lips:                  Previously seen        Bladder:                Appears normal
 Heart:                 Not well visualized    Spine:                  Limited views
                                                                       appear normal
 RVOT:                  Appears normal         Upper Extremities:      Not well visualized
 LVOT:                  Not well visualized    Lower Extremities:      Not well visualized

 Other:  Technically difficult due to advanced GA and fetal position.
Cervix Uterus Adnexa

 Cervix
 Normal appearance by transvaginal scan
Impression

 Patient with placenta previa returned for follow-up scan. She
 does not give history of vaginal bleeding. Fetal growth is
 appropriate for gestational age. Amniotic fluid is normal and
 good fetal activity is seen. We performed transvaginal
 ultrasound to evaluate the placenta. Placenta previa is seen
 covering the internal os.

 I explained the diagnosis of placenta previa.

 Recommend delivery at 37 weeks. Patient has an
 appointment with you this week.

 I discussed the findings with Daye, RN.
Recommendations

 -Delivery at 37 weeks.
                 Stowers, Xin
